# Patient Record
Sex: Female | Born: 1950
Health system: Southern US, Community
[De-identification: ages and names within clinical notes are randomized; demographics above are authoritative.]

## PROBLEM LIST (undated history)

## (undated) DIAGNOSIS — R42 Dizziness and giddiness: Secondary | ICD-10-CM

## (undated) DIAGNOSIS — I10 Essential (primary) hypertension: Secondary | ICD-10-CM

## (undated) DIAGNOSIS — T7840XA Allergy, unspecified, initial encounter: Secondary | ICD-10-CM

## (undated) DIAGNOSIS — R011 Cardiac murmur, unspecified: Secondary | ICD-10-CM

## (undated) HISTORY — DX: Allergy, unspecified, initial encounter: T78.40XA

## (undated) HISTORY — PX: ABDOMINAL HYSTERECTOMY: SHX81

## (undated) HISTORY — PX: TONSILLECTOMY: SUR1361

## (undated) HISTORY — DX: Cardiac murmur, unspecified: R01.1

## (undated) HISTORY — DX: Essential (primary) hypertension: I10

---

## 1998-07-04 ENCOUNTER — Emergency Department (HOSPITAL_COMMUNITY): Admission: EM | Admit: 1998-07-04 | Discharge: 1998-07-04 | Payer: Self-pay | Admitting: Emergency Medicine

## 1999-05-09 ENCOUNTER — Emergency Department (HOSPITAL_COMMUNITY): Admission: EM | Admit: 1999-05-09 | Discharge: 1999-05-09 | Payer: Self-pay | Admitting: Internal Medicine

## 1999-07-08 ENCOUNTER — Emergency Department (HOSPITAL_COMMUNITY): Admission: EM | Admit: 1999-07-08 | Discharge: 1999-07-08 | Payer: Self-pay | Admitting: Emergency Medicine

## 2001-01-18 ENCOUNTER — Encounter: Admission: RE | Admit: 2001-01-18 | Discharge: 2001-01-18 | Payer: Self-pay | Admitting: Family Medicine

## 2001-01-24 ENCOUNTER — Encounter: Admission: RE | Admit: 2001-01-24 | Discharge: 2001-01-24 | Payer: Self-pay | Admitting: Sports Medicine

## 2001-01-24 ENCOUNTER — Encounter: Payer: Self-pay | Admitting: Sports Medicine

## 2001-02-15 ENCOUNTER — Encounter: Admission: RE | Admit: 2001-02-15 | Discharge: 2001-02-15 | Payer: Self-pay | Admitting: Family Medicine

## 2001-03-16 ENCOUNTER — Encounter: Admission: RE | Admit: 2001-03-16 | Discharge: 2001-03-16 | Payer: Self-pay | Admitting: Family Medicine

## 2003-10-25 ENCOUNTER — Emergency Department (HOSPITAL_COMMUNITY): Admission: EM | Admit: 2003-10-25 | Discharge: 2003-10-25 | Payer: Self-pay

## 2007-05-28 ENCOUNTER — Emergency Department (HOSPITAL_COMMUNITY): Admission: EM | Admit: 2007-05-28 | Discharge: 2007-05-28 | Payer: Self-pay | Admitting: Emergency Medicine

## 2008-05-09 ENCOUNTER — Emergency Department (HOSPITAL_COMMUNITY): Admission: EM | Admit: 2008-05-09 | Discharge: 2008-05-09 | Payer: Self-pay | Admitting: Emergency Medicine

## 2010-10-22 ENCOUNTER — Emergency Department (HOSPITAL_COMMUNITY): Admission: EM | Admit: 2010-10-22 | Discharge: 2010-10-22 | Payer: Self-pay | Admitting: Emergency Medicine

## 2011-02-16 LAB — URINE MICROSCOPIC-ADD ON

## 2011-02-16 LAB — URINALYSIS, ROUTINE W REFLEX MICROSCOPIC
Bilirubin Urine: NEGATIVE
Glucose, UA: NEGATIVE mg/dL
Hgb urine dipstick: NEGATIVE
Ketones, ur: NEGATIVE mg/dL
Leukocytes, UA: NEGATIVE
Nitrite: POSITIVE — AB
Protein, ur: NEGATIVE mg/dL
Specific Gravity, Urine: 1.014 (ref 1.005–1.030)
Urobilinogen, UA: 1 mg/dL (ref 0.0–1.0)
pH: 7.5 (ref 5.0–8.0)

## 2011-02-16 LAB — DIFFERENTIAL
Basophils Absolute: 0.1 10*3/uL (ref 0.0–0.1)
Basophils Relative: 1 % (ref 0–1)
Eosinophils Absolute: 0.1 10*3/uL (ref 0.0–0.7)
Monocytes Absolute: 1 10*3/uL (ref 0.1–1.0)
Monocytes Relative: 9 % (ref 3–12)

## 2011-02-16 LAB — CBC
HCT: 37.4 % (ref 36.0–46.0)
MCV: 91.5 fL (ref 78.0–100.0)
Platelets: 280 10*3/uL (ref 150–400)
RBC: 4.09 MIL/uL (ref 3.87–5.11)
RDW: 13.4 % (ref 11.5–15.5)
WBC: 10.7 10*3/uL — ABNORMAL HIGH (ref 4.0–10.5)

## 2011-02-16 LAB — COMPREHENSIVE METABOLIC PANEL
ALT: 11 U/L (ref 0–35)
Albumin: 3.5 g/dL (ref 3.5–5.2)
Alkaline Phosphatase: 73 U/L (ref 39–117)
BUN: 18 mg/dL (ref 6–23)
Chloride: 101 mEq/L (ref 96–112)
Glucose, Bld: 94 mg/dL (ref 70–99)
Potassium: 4.5 mEq/L (ref 3.5–5.1)
Sodium: 140 mEq/L (ref 135–145)
Total Bilirubin: 0.4 mg/dL (ref 0.3–1.2)
Total Protein: 7.6 g/dL (ref 6.0–8.3)

## 2011-02-16 LAB — URINE CULTURE: Colony Count: >=100000

## 2011-09-02 LAB — URINALYSIS, ROUTINE W REFLEX MICROSCOPIC
Bilirubin Urine: NEGATIVE
Ketones, ur: NEGATIVE
Nitrite: NEGATIVE
Protein, ur: NEGATIVE
Urobilinogen, UA: 0.2

## 2011-09-02 LAB — DIFFERENTIAL
Basophils Relative: 1
Eosinophils Absolute: 0
Eosinophils Relative: 1
Lymphocytes Relative: 35
Monocytes Relative: 7
Monocytes Relative: 8
Neutrophils Relative %: 67

## 2011-09-02 LAB — COMPREHENSIVE METABOLIC PANEL
ALT: 13
CO2: 27
Calcium: 8.9
Creatinine, Ser: 0.73
GFR calc non Af Amer: >60
Glucose, Bld: 130 — ABNORMAL HIGH
Sodium: 140
Total Protein: 6.6

## 2011-09-02 LAB — CBC
HCT: 36.2
MCHC: 33.3
MCV: 89.5
Platelets: 271
RDW: 13.6

## 2012-02-04 ENCOUNTER — Encounter (HOSPITAL_COMMUNITY): Payer: Self-pay

## 2012-02-04 ENCOUNTER — Emergency Department (HOSPITAL_COMMUNITY)
Admission: EM | Admit: 2012-02-04 | Discharge: 2012-02-04 | Disposition: A | Discharge status: Home / Self Care | Payer: 59 | Attending: Emergency Medicine | Admitting: Emergency Medicine

## 2012-02-04 DIAGNOSIS — R05 Cough: Secondary | ICD-10-CM | POA: Insufficient documentation

## 2012-02-04 DIAGNOSIS — R059 Cough, unspecified: Secondary | ICD-10-CM | POA: Insufficient documentation

## 2012-02-04 DIAGNOSIS — R1110 Vomiting, unspecified: Secondary | ICD-10-CM

## 2012-02-04 DIAGNOSIS — R197 Diarrhea, unspecified: Secondary | ICD-10-CM | POA: Insufficient documentation

## 2012-02-04 DIAGNOSIS — R112 Nausea with vomiting, unspecified: Secondary | ICD-10-CM | POA: Insufficient documentation

## 2012-02-04 DIAGNOSIS — R5381 Other malaise: Secondary | ICD-10-CM | POA: Insufficient documentation

## 2012-02-04 DIAGNOSIS — R5383 Other fatigue: Secondary | ICD-10-CM | POA: Insufficient documentation

## 2012-02-04 DIAGNOSIS — R51 Headache: Secondary | ICD-10-CM | POA: Insufficient documentation

## 2012-02-04 DIAGNOSIS — Z79899 Other long term (current) drug therapy: Secondary | ICD-10-CM | POA: Insufficient documentation

## 2012-02-04 LAB — CBC
HCT: 39.4 % (ref 36.0–46.0)
Hemoglobin: 12.7 g/dL (ref 12.0–15.0)
MCH: 29.3 pg (ref 26.0–34.0)
MCV: 91 fL (ref 78.0–100.0)
Platelets: 301 10*3/uL (ref 150–400)
RBC: 4.33 MIL/uL (ref 3.87–5.11)
WBC: 9.1 10*3/uL (ref 4.0–10.5)

## 2012-02-04 LAB — BASIC METABOLIC PANEL
CO2: 31 mEq/L (ref 19–32)
Calcium: 9.5 mg/dL (ref 8.4–10.5)
Chloride: 98 mEq/L (ref 96–112)
Glucose, Bld: 111 mg/dL — ABNORMAL HIGH (ref 70–99)
Potassium: 4 mEq/L (ref 3.5–5.1)
Sodium: 136 mEq/L (ref 135–145)

## 2012-02-04 LAB — URINALYSIS, ROUTINE W REFLEX MICROSCOPIC
Bilirubin Urine: NEGATIVE
Glucose, UA: NEGATIVE mg/dL
Hgb urine dipstick: NEGATIVE
Ketones, ur: NEGATIVE mg/dL
Nitrite: NEGATIVE
Specific Gravity, Urine: 1.012 (ref 1.005–1.030)
pH: 6 (ref 5.0–8.0)

## 2012-02-04 MED ORDER — SODIUM CHLORIDE 0.9 % IV BOLUS (SEPSIS)
1000.0000 mL | Freq: Once | INTRAVENOUS | Status: AC
  Administered 2012-02-04: 1000 mL via INTRAVENOUS

## 2012-02-04 MED ORDER — ONDANSETRON 4 MG PO TBDP: 4.0000 mg | ORAL_TABLET | Freq: Three times a day (TID) | ORAL | Status: AC | PRN

## 2012-02-04 MED ORDER — DIPHENOXYLATE-ATROPINE 2.5-0.025 MG PO TABS: 1.0000 | ORAL_TABLET | Freq: Four times a day (QID) | ORAL | Status: DC | PRN

## 2012-02-04 MED ORDER — ONDANSETRON HCL 4 MG/2ML IJ SOLN
4.0000 mg | Freq: Once | INTRAMUSCULAR | Status: AC
  Administered 2012-02-04: 4 mg via INTRAVENOUS
  Filled 2012-02-04: qty 2

## 2012-02-04 MED ORDER — DIPHENOXYLATE-ATROPINE 2.5-0.025 MG PO TABS: 1.0000 | ORAL_TABLET | Freq: Four times a day (QID) | ORAL | Status: AC | PRN

## 2012-02-04 MED ORDER — ONDANSETRON 4 MG PO TBDP: 4.0000 mg | ORAL_TABLET | Freq: Three times a day (TID) | ORAL | Status: DC | PRN

## 2012-02-04 NOTE — ED Notes (Signed)
Pt here with c/o N/V/D/cough since Tuesday with increase in intensity, also c/o generealized weakness and fatigue.

## 2012-02-04 NOTE — ED Provider Notes (Signed)
History     CSN: [id number]  Arrival date & time 02/04/12  4098   First MD Initiated Contact with Patient 02/04/12 [phone]      Chief Complaint  Patient presents with  . Nausea  . Emesis  . Diarrhea  . Cough  . Headache    (Consider location/radiation/quality/duration/timing/severity/associated sxs/prior treatment) HPI  Pt presents to the ED with complaints of nausea, vomiting, diarrhea, cough and headache since Tuesday. The course of her symptoms have been stable and not getting worse. 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 She then began to cough and vomited up mucus. She called he daughter and had her bring her to the ED. The patient admits to getting the flu shot this year. She denies having any abdominal pains, chest pains, fevers, or focal weakness. She admits to feeling weaker than normal because she isn't keeping any fluids down.  History reviewed. No pertinent past medical history.  Past Surgical History  Procedure Date  . Abdominal hysterectomy     History reviewed. No pertinent family history.  History  Substance Use Topics  . Smoking status: Former Games developer  . Smokeless tobacco: Not on file  . Alcohol Use:     OB History    Grav Para Term Preterm Abortions TAB SAB Ect Mult Living                  Review of Systems  All other systems reviewed and are negative.    Allergies  Review of patient's allergies indicates no known allergies.  Home Medications   Current Outpatient Rx  Name Route Sig Dispense Refill  . GUAIFENESIN 100 MG/5ML PO SOLN Oral Take 10 mLs by mouth every 4 (four) hours as needed. For cough.    [name] DIPHENOXYLATE-ATROPINE 2.5-0.025 MG PO TABS Oral Take 1 tablet by mouth 4 (four) times daily as needed for diarrhea or loose stools. 30 tablet 0  . ONDANSETRON 4 MG PO TBDP Oral Take 1 tablet (4 mg total) by mouth every 8 (eight) hours as needed for nausea. 20 tablet  0    BP 136/74  Pulse 70  Temp(Src) 97.6 F (36.4 C) (Oral)  Resp 18  Ht 5\' 6"  (1.676 m)  Wt 200 lb (90.719 kg)  BMI 32.28 kg/m2  SpO2 98%  Physical Exam  Nursing note and vitals reviewed. Constitutional: She appears well-developed and well-nourished. No distress.  HENT:  Head: Normocephalic and atraumatic.  Eyes: Pupils are equal, round, and reactive to light.  Neck: Normal range of motion. Neck supple.  Cardiovascular: Normal rate and regular rhythm.   Pulmonary/Chest: Effort normal. She has no wheezes. She exhibits no tenderness.  Abdominal: Soft. She exhibits no distension and no mass. There is no tenderness. There is no rebound and no guarding.  Musculoskeletal:       Left hip: She exhibits normal range of motion, normal strength, no tenderness, no bony tenderness, no swelling, no crepitus, no deformity and no laceration.       Legs: Neurological: She is alert.  Skin: Skin is warm and dry.    ED Course  Procedures (including critical care time)  Labs Reviewed  BASIC METABOLIC PANEL - Abnormal; Notable for the following:    Glucose, Bld 111 (*)    All other components within normal limits  CBC  URINALYSIS, ROUTINE W REFLEX MICROSCOPIC   No results found.   1. Vomiting  and diarrhea       MDM  pts laboratory work has come back showing no abnormal lab values. Pt has been given 2 L fluids in ED and Zofran. Pt feeling much better. Discussed importance of staying hydrated with patient and will give work-note so that she can rest.   Pt given Rx for lomotil and Zofran to help with symptoms as needed.        Dorthula Matas, PA 02/04/12 1011

## 2012-02-04 NOTE — Discharge Instructions (Signed)
B.R.A.T. Diet Your doctor has recommended the B.R.A.T. diet for you or your child until the condition improves. This is often used to help control diarrhea and vomiting symptoms. If you or your child can tolerate clear liquids, you may have:  Bananas.   Rice.   Applesauce.   Toast (and other simple starches such as crackers, potatoes, noodles).  Be sure to avoid dairy products, meats, and fatty foods until symptoms are better. Fruit juices such as apple, grape, and prune juice can make diarrhea worse. Avoid these. Continue this diet for 2 days or as instructed by your caregiver. Document Released: 11/22/2005 Document Revised: 08/04/2011 Document Reviewed: 05/11/2007 [facility] Patient Information 2012 [place], [place].Diet for Diarrhea, Adult Having frequent, runny stools (diarrhea) has many causes. Diarrhea may be caused or worsened by food or drink. Diarrhea may be relieved by changing your diet. IF YOU ARE NOT TOLERATING SOLID FOODS:  Drink enough water and fluids to keep your urine clear or pale yellow.   Avoid sugary drinks and sodas as well as milk-based beverages.   Avoid beverages containing caffeine and alcohol.   You may try rehydrating beverages. You can make your own by following this recipe:    tsp table salt.    tsp baking soda.   ? tsp salt substitute (potassium chloride).   1 tbs + 1 tsp sugar.   1 qt water.  As your stools become more solid, you can start eating solid foods. Add foods one at a time. If a certain food causes your diarrhea to get worse, avoid that food and try other foods. A low fiber, low-fat, and lactose-free diet is recommended. Small, frequent meals may be better tolerated.  Starches  Allowed:  White, [place], and pita breads, plain rolls, buns, bagels. Plain muffins, matzo. Soda, saltine, or graham crackers. Pretzels, melba toast, zwieback. Cooked cereals made with water: cornmeal, farina, cream cereals. Dry cereals: refined corn, wheat, rice.  Potatoes prepared any way without skins, refined macaroni, spaghetti, noodles, refined rice.   Avoid:  Bread, rolls, or crackers made with whole wheat, multi-grains, rye, bran seeds, nuts, or coconut. Corn tortillas or taco shells. Cereals containing whole grains, multi-grains, bran, coconut, nuts, or raisins. Cooked or dry oatmeal. Coarse wheat cereals, granola. Cereals advertised as "high-fiber." Potato skins. Whole grain pasta, wild or brown rice. Popcorn. Sweet potatoes/yams. Sweet rolls, doughnuts, waffles, pancakes, sweet breads.  Vegetables  Allowed: Strained tomato and vegetable juices. Most well-cooked and canned vegetables without seeds. Fresh: Tender lettuce, cucumber without the skin, cabbage, spinach, bean sprouts.   Avoid: Fresh, cooked, or canned: Artichokes, baked beans, beet greens, broccoli, Brussels sprouts, corn, kale, legumes, peas, sweet potatoes. Cooked: Green or red cabbage, spinach. Avoid large servings of any vegetables, because vegetables shrink when cooked, and they contain more fiber per serving than fresh vegetables.  Fruit  Allowed: All fruit juices except prune juice. Cooked or canned: Apricots, applesauce, cantaloupe, cherries, fruit cocktail, grapefruit, grapes, kiwi, mandarin oranges, peaches, pears, plums, watermelon. Fresh: Apples without skin, ripe banana, grapes, cantaloupe, cherries, grapefruit, peaches, oranges, plums. Keep servings limited to  cup or 1 piece.   Avoid: Fresh: Apple with skin, apricots, mango, pears, raspberries, strawberries. Prune juice, stewed or dried prunes. Dried fruits, raisins, dates. Large servings of all fresh fruits.  Meat and Meat Substitutes  Allowed: Ground or well-cooked tender beef, ham, veal, lamb, pork, or poultry. Eggs, plain cheese. Fish, oysters, shrimp, lobster, other seafoods. Liver, organ meats.   Avoid: Tough, fibrous meats with gristle. Peanut  butter, smooth or chunky. Cheese, nuts, seeds, legumes, dried peas, beans,  lentils.  Milk  Allowed: Yogurt, lactose-free milk, kefir, drinkable yogurt, buttermilk, soy milk.   Avoid: Milk, chocolate milk, beverages made with milk, such as milk shakes.  Soups  Allowed: Bouillon, broth, or soups made from allowed foods. Any strained soup.   Avoid: Soups made from vegetables that are not allowed, cream or milk-based soups.  Desserts and Sweets  Allowed: Sugar-free gelatin, sugar-free frozen ice pops made without sugar alcohol.   Avoid: Plain cakes and cookies, pie made with allowed fruit, pudding, custard, cream pie. Gelatin, fruit, ice, sherbet, frozen ice pops. Ice cream, ice milk without nuts. Plain hard candy, honey, jelly, molasses, syrup, sugar, chocolate syrup, gumdrops, marshmallows.  Fats and Oils  Allowed: Avoid any fats and oils.   Avoid: Seeds, nuts, olives, avocados. Margarine, butter, cream, mayonnaise, salad oils, plain salad dressings made from allowed foods. Plain gravy, crisp bacon without rind.  Beverages  Allowed: Water, decaffeinated teas, oral rehydration solutions, sugar-free beverages.   Avoid: Fruit juices, caffeinated beverages (coffee, tea, soda or pop), alcohol, sports drinks, or lemon-lime soda or pop.  Condiments  Allowed: Ketchup, mustard, horseradish, vinegar, cream sauce, cheese sauce, cocoa powder. Spices in moderation: allspice, basil, bay leaves, celery powder or leaves, cinnamon, cumin powder, curry powder, ginger, mace, marjoram, onion or garlic powder, oregano, paprika, parsley flakes, ground pepper, rosemary, sage, savory, tarragon, thyme, turmeric.   Avoid: Coconut, honey.  Weight Monitoring: Weigh yourself every day. You should weigh yourself in the morning after you urinate and before you eat breakfast. Wear the same amount of clothing when you weigh yourself. Record your weight daily. Bring your recorded weights to your clinic visits. Tell your caregiver right away if you have gained 3 lb/1.4 kg or more in 1 day, 5  lb/2.3 kg in a week, or whatever amount you were told to report. SEEK IMMEDIATE MEDICAL CARE IF:   You are unable to keep fluids down.   You start to throw up (vomit) or diarrhea keeps coming back (persistent).   Abdominal pain develops, increases, or can be felt in one place (localizes).   You have an oral temperature above 102 F (38.9 C), not controlled by medicine.   Diarrhea contains blood or mucus.   You develop excessive weakness, dizziness, fainting, or extreme thirst.  MAKE SURE YOU:   Understand these instructions.   Will watch your condition.   Will get help right away if you are not doing well or get worse.  Document Released: 02/12/2004 Document Revised: 08/04/2011 Document Reviewed: 06/05/2009 [facility] Patient Information 2012 [place], [place].Nausea and Vomiting Nausea is a sick feeling that often comes before throwing up (vomiting). Vomiting is a reflex where stomach contents come out of your mouth. Vomiting can cause severe loss of body fluids (dehydration). Children and elderly adults can become dehydrated quickly, especially if they also have diarrhea. Nausea and vomiting are symptoms of a condition or disease. It is important to find the cause of your symptoms. CAUSES   Direct irritation of the stomach lining. This irritation can result from increased acid production (gastroesophageal reflux disease), infection, food poisoning, taking certain medicines (such as nonsteroidal anti-inflammatory drugs), alcohol use, or tobacco use.   Signals from the brain.These signals could be caused by a headache, heat exposure, an inner ear disturbance, increased pressure in the brain from injury, infection, a tumor, or a concussion, pain, emotional stimulus, or metabolic problems.   An obstruction in the gastrointestinal tract (  bowel obstruction).   Illnesses such as diabetes, hepatitis, gallbladder problems, appendicitis, kidney problems, cancer, sepsis, atypical symptoms of a  heart attack, or eating disorders.   Medical treatments such as chemotherapy and radiation.   Receiving medicine that makes you sleep (general anesthetic) during surgery.  DIAGNOSIS Your caregiver may ask for tests to be done if the problems do not improve after a few days. Tests may also be done if symptoms are severe or if the reason for the nausea and vomiting is not clear. Tests may include:  Urine tests.   Blood tests.   Stool tests.   Cultures (to look for evidence of infection).   X-rays or other imaging studies.  Test results can help your caregiver make decisions about treatment or the need for additional tests. TREATMENT You need to stay well hydrated. Drink frequently but in small amounts.You may wish to drink water, sports drinks, clear broth, or eat frozen ice pops or gelatin dessert to help stay hydrated.When you eat, eating slowly may help prevent nausea.There are also some antinausea medicines that may help prevent nausea. HOME CARE INSTRUCTIONS   Take all medicine as directed by your caregiver.   If you do not have an appetite, do not force yourself to eat. However, you must continue to drink fluids.   If you have an appetite, eat a normal diet unless your caregiver tells you differently.   Eat a variety of complex carbohydrates (rice, wheat, potatoes, bread), lean meats, yogurt, fruits, and vegetables.   Avoid high-fat foods because they are more difficult to digest.   Drink enough water and fluids to keep your urine clear or pale yellow.   If you are dehydrated, ask your caregiver for specific rehydration instructions. Signs of dehydration may include:   Severe thirst.   Dry lips and mouth.   Dizziness.   Dark urine.   Decreasing urine frequency and amount.   Confusion.   Rapid breathing or pulse.  SEEK IMMEDIATE MEDICAL CARE IF:   You have blood or brown flecks (like coffee grounds) in your vomit.   You have black or bloody stools.   You  have a severe headache or stiff neck.   You are confused.   You have severe abdominal pain.   You have chest pain or trouble breathing.   You do not urinate at least once every 8 hours.   You develop cold or clammy skin.   You continue to vomit for longer than 24 to 48 hours.   You have a fever.  MAKE SURE YOU:   Understand these instructions.   Will watch your condition.   Will get help right away if you are not doing well or get worse.  Document Released: 11/22/2005 Document Revised: 08/04/2011 Document Reviewed: 04/21/2011 [facility] Patient Information 2012 [place], [place].  RESOURCE GUIDE  Dental Problems  Patients with Medicaid: [facility] [phone]                                           [phone] [address] [employer] Phone:  [phone]  Phone:  [phone]  If unable to pay or uninsured, contact:  [facility] or [facility]. to become qualified for the adult dental clinic.  Chronic Pain Problems Contact [facility]  [phone] Patients need to be referred by their primary care doctor.  Insufficient Money for Medicine Contact [employer]:  call "211" or [facility] 860-179-2491.  No Primary Care Doctor Call [facility]  [phone] Other agencies that provide inexpensive medical care    [facility]  [phone]    [facility]  [phone]    [facility]  [phone]    [facility]  [phone]    [facility]  [phone]    [facility]  [phone]  Psychological Services [facility] Behavioral Health  [phone] [facility]  [phone] [facility] Mental Health   [phone] (emergency services [phone])  Substance Abuse Resources [facility]  [phone] [facility] [phone] [facility] [place] [phone] Floydene Flock  [phone] Residential & Outpatient Substance Abuse Program  [phone]  Abuse/Neglect [facility] Child Abuse Hotline 830-599-8143 [facility] Child Abuse Hotline [phone] (After Hours)  Emergency Shelter [facility] [phone]  Maternity Homes [facility] [place] [facility] [place] [phone] [facility] [phone]  MRSA Hotline #:   820-604-6686    [facility] Resources  [facility] [place]     [employer]                          [facility] Dept. [address] [place]                       [address]      [address]  Blondell Reveal Phone:  [phone]                                   Phone:  [phone]                 Phone:  [phone]  [facility] Mental Health Phone:  [phone]  [facility] Child Abuse Hotline (906)334-3999 780-036-1412 (After Hours)

## 2012-02-04 NOTE — ED Notes (Signed)
Pt given discharge instructions and rx, verb understanding, amb with steady gait to discharge window.

## 2012-02-06 NOTE — ED Provider Notes (Signed)
Medical screening examination/treatment/procedure(s) were performed by non-physician practitioner and as supervising physician I was immediately available for consultation/collaboration.   Hurman Horn, MD 02/06/12 [phone]

## 2014-08-19 ENCOUNTER — Emergency Department (HOSPITAL_COMMUNITY): Payer: 59

## 2014-08-19 ENCOUNTER — Encounter (HOSPITAL_COMMUNITY): Payer: Self-pay | Admitting: Emergency Medicine

## 2014-08-19 ENCOUNTER — Emergency Department (HOSPITAL_COMMUNITY)
Admission: EM | Admit: 2014-08-19 | Discharge: 2014-08-19 | Disposition: A | Discharge status: Home / Self Care | Payer: 59 | Attending: Emergency Medicine | Admitting: Emergency Medicine

## 2014-08-19 DIAGNOSIS — R42 Dizziness and giddiness: Secondary | ICD-10-CM | POA: Diagnosis not present

## 2014-08-19 DIAGNOSIS — Z791 Long term (current) use of non-steroidal anti-inflammatories (NSAID): Secondary | ICD-10-CM | POA: Insufficient documentation

## 2014-08-19 DIAGNOSIS — I10 Essential (primary) hypertension: Secondary | ICD-10-CM | POA: Insufficient documentation

## 2014-08-19 DIAGNOSIS — R197 Diarrhea, unspecified: Secondary | ICD-10-CM | POA: Diagnosis not present

## 2014-08-19 DIAGNOSIS — Z87891 Personal history of nicotine dependence: Secondary | ICD-10-CM | POA: Diagnosis not present

## 2014-08-19 DIAGNOSIS — R011 Cardiac murmur, unspecified: Secondary | ICD-10-CM | POA: Insufficient documentation

## 2014-08-19 DIAGNOSIS — R112 Nausea with vomiting, unspecified: Secondary | ICD-10-CM | POA: Insufficient documentation

## 2014-08-19 LAB — COMPREHENSIVE METABOLIC PANEL
ALBUMIN: 3.8 g/dL (ref 3.5–5.2)
ALT: 9 U/L (ref 0–35)
AST: 12 U/L (ref 0–37)
Alkaline Phosphatase: 74 U/L (ref 39–117)
Anion gap: 13 (ref 5–15)
BUN: 17 mg/dL (ref 6–23)
CO2: 27 mEq/L (ref 19–32)
Calcium: 9.6 mg/dL (ref 8.4–10.5)
Chloride: 96 mEq/L (ref 96–112)
Creatinine, Ser: 0.64 mg/dL (ref 0.50–1.10)
GFR calc Af Amer: >90 mL/min (ref >90)
GFR calc non Af Amer: >90 mL/min (ref >90)
Glucose, Bld: 119 mg/dL — ABNORMAL HIGH (ref 70–99)
POTASSIUM: 4 meq/L (ref 3.7–5.3)
SODIUM: 136 meq/L — AB (ref 137–147)
TOTAL PROTEIN: 8.1 g/dL (ref 6.0–8.3)
Total Bilirubin: 0.3 mg/dL (ref 0.3–1.2)

## 2014-08-19 LAB — TROPONIN I: Troponin I: <0.3 ng/mL (ref <0.30)

## 2014-08-19 LAB — URINALYSIS, ROUTINE W REFLEX MICROSCOPIC
Bilirubin Urine: NEGATIVE
Glucose, UA: NEGATIVE mg/dL
HGB URINE DIPSTICK: NEGATIVE
Ketones, ur: NEGATIVE mg/dL
LEUKOCYTES UA: NEGATIVE
Nitrite: NEGATIVE
Protein, ur: NEGATIVE mg/dL
SPECIFIC GRAVITY, URINE: 1.012 (ref 1.005–1.030)
UROBILINOGEN UA: 0.2 mg/dL (ref 0.0–1.0)
pH: 7.5 (ref 5.0–8.0)

## 2014-08-19 LAB — CBC WITH DIFFERENTIAL/PLATELET
BASOS ABS: 0 10*3/uL (ref 0.0–0.1)
BASOS PCT: 0 % (ref 0–1)
EOS PCT: 0 % (ref 0–5)
Eosinophils Absolute: 0 10*3/uL (ref 0.0–0.7)
HCT: 40.2 % (ref 36.0–46.0)
Hemoglobin: 13.2 g/dL (ref 12.0–15.0)
LYMPHS PCT: 15 % (ref 12–46)
Lymphs Abs: 1.5 10*3/uL (ref 0.7–4.0)
MCH: 29.9 pg (ref 26.0–34.0)
MCHC: 32.8 g/dL (ref 30.0–36.0)
MCV: 91 fL (ref 78.0–100.0)
Monocytes Absolute: 0.5 10*3/uL (ref 0.1–1.0)
Monocytes Relative: 5 % (ref 3–12)
NEUTROS ABS: 7.7 10*3/uL (ref 1.7–7.7)
Neutrophils Relative %: 80 % — ABNORMAL HIGH (ref 43–77)
PLATELETS: 256 10*3/uL (ref 150–400)
RBC: 4.42 MIL/uL (ref 3.87–5.11)
RDW: 13.4 % (ref 11.5–15.5)
WBC: 9.7 10*3/uL (ref 4.0–10.5)

## 2014-08-19 MED ORDER — ONDANSETRON 4 MG PO TBDP: ORAL_TABLET | ORAL | Status: DC

## 2014-08-19 MED ORDER — MECLIZINE HCL 25 MG PO TABS
25.0000 mg | ORAL_TABLET | Freq: Once | ORAL | Status: AC
  Administered 2014-08-19: 25 mg via ORAL
  Filled 2014-08-19: qty 1

## 2014-08-19 MED ORDER — SODIUM CHLORIDE 0.9 % IV BOLUS (SEPSIS)
1000.0000 mL | Freq: Once | INTRAVENOUS | Status: AC
  Administered 2014-08-19: 1000 mL via INTRAVENOUS

## 2014-08-19 MED ORDER — MECLIZINE HCL 25 MG PO TABS: 12.5000 mg | ORAL_TABLET | Freq: Three times a day (TID) | ORAL | Status: DC | PRN

## 2014-08-19 MED ORDER — ONDANSETRON HCL 4 MG/2ML IJ SOLN
4.0000 mg | Freq: Once | INTRAMUSCULAR | Status: AC
  Administered 2014-08-19: 4 mg via INTRAVENOUS
  Filled 2014-08-19: qty 2

## 2014-08-19 MED ORDER — LORAZEPAM 2 MG/ML IJ SOLN
1.0000 mg | Freq: Once | INTRAMUSCULAR | Status: AC
  Administered 2014-08-19: 1 mg via INTRAVENOUS
  Filled 2014-08-19: qty 1

## 2014-08-19 NOTE — ED Notes (Signed)
Pt went to the restroom patient missed the hat in the toilet. Dr is aware

## 2014-08-19 NOTE — ED Notes (Signed)
Bed: WA20 Expected date:  Expected time:  Means of arrival:  Comments: EMS-high BP

## 2014-08-19 NOTE — ED Notes (Signed)
Patient transported to CT 

## 2014-08-19 NOTE — ED Provider Notes (Signed)
CSN: [id number]     Arrival date & time 08/19/14  1041 History   First MD Initiated Contact with Patient 08/19/14 1115     Chief Complaint  Patient presents with  . Nausea  . Emesis  . Hypertension     (Consider location/radiation/quality/duration/timing/severity/associated sxs/prior Treatment) HPI 63 year old female presents with nausea since yesterday. Today started having vomiting and dizziness. She describes the dizziness as a room spinning sensation. It does not feel like she is going to pass out. There's no associated headache, chest pain, palpitations, or shortness of breath. No weakness or numbness. No confusion or trouble speaking. She had one loose bowel movement but otherwise not had diarrhea. She had [place] food last night but is not sure if it causes her symptoms or not. The patient denies any pain at this time. The dizziness only occurs with sitting there is not occur with turning her head.  History reviewed. No pertinent past medical history. Past Surgical History  Procedure Laterality Date  . Abdominal hysterectomy     No family history on file. History  Substance Use Topics  . Smoking status: Former Games developer  . Smokeless tobacco: Not on file  . Alcohol Use:    OB History   Grav Para Term Preterm Abortions TAB SAB Ect Mult Living                 Review of Systems  Constitutional: Negative for fever.  Respiratory: Negative for shortness of breath.   Cardiovascular: Negative for chest pain.  Gastrointestinal: Positive for nausea, vomiting and diarrhea (one loose bowel movement). Negative for abdominal pain.  Neurological: Positive for dizziness. Negative for weakness, numbness and headaches.  All other systems reviewed and are negative.     Allergies  Review of patient's allergies indicates no known allergies.  Home Medications   Prior to Admission medications   Medication Sig Start Date End Date Taking? Authorizing Provider  Naproxen Sodium (ALEVE PO)  Take 2 tablets by mouth 1 day or 1 dose.   Yes Historical Provider, MD   BP 139/75  Pulse 64  Temp(Src) 97.6 F (36.4 C) (Oral)  Resp 20  SpO2 97% Physical Exam  Nursing note and vitals reviewed. Constitutional: She is oriented to person, place, and time. She appears well-developed and well-nourished. No distress.  HENT:  Head: Normocephalic and atraumatic.  Right Ear: External ear normal.  Left Ear: External ear normal.  Nose: Nose normal.  Eyes: EOM are normal. Pupils are equal, round, and reactive to light. Right eye exhibits no discharge. Left eye exhibits no discharge.  No nystagmus  Cardiovascular: Normal rate and regular rhythm.   Murmur heard. Pulmonary/Chest: Effort normal and breath sounds normal.  Abdominal: Soft. She exhibits no distension. There is no tenderness.  Neurological: She is alert and oriented to person, place, and time. She has normal reflexes.  CN 2-12 grossly intact. 5/5 strength in all 4 extremities. Normal finger to nose and heel to shin  Skin: Skin is warm and dry.    ED Course  Procedures (including critical care time) Labs Review Labs Reviewed  CBC WITH DIFFERENTIAL - Abnormal; Notable for the following:    Neutrophils Relative % 80 (*)    All other components within normal limits  COMPREHENSIVE METABOLIC PANEL - Abnormal; Notable for the following:    Sodium 136 (*)    Glucose, Bld 119 (*)    All other components within normal limits  TROPONIN I  URINALYSIS, ROUTINE W REFLEX MICROSCOPIC  CBG  MONITORING, ED    Imaging Review Ct Head Wo Contrast  08/19/2014   CLINICAL DATA:  Nausea, vomiting, vertigo.  EXAM: CT HEAD WITHOUT CONTRAST  TECHNIQUE: Contiguous axial images were obtained from the base of the skull through the vertex without intravenous contrast.  COMPARISON:  None.  FINDINGS: There is chronic diffuse atrophy. There is no midline shift, hydrocephalus, or mass. No acute hemorrhage or acute transcortical infarct is identified. The  bony calvarium is intact. The visualized sinuses are clear.  IMPRESSION: No focal acute intracranial abnormality identified. Chronic diffuse atrophy.   Electronically Signed   By: Sherian Rein M.D.   On: 08/19/2014 12:41   Mr Brain Wo Contrast  08/19/2014   CLINICAL DATA:  Nausea, vomiting and dizziness since 08/18/2014. No known injury. Question cerebellar infarct. Initial encounter.  EXAM: MRI HEAD WITHOUT CONTRAST  TECHNIQUE: Multiplanar, multiecho pulse sequences of the brain and surrounding structures were obtained without intravenous contrast.  COMPARISON:  08/19/2014 CT.  No comparison MR  FINDINGS: Some of the sequences are motion degraded. Fast technique imaging had to be utilized.  No acute infarct.  No intracranial hemorrhage.  No hydrocephalus.  No intracranial mass lesion noted on this unenhanced exam.  Major intracranial vascular structures are patent.  Cervical medullary junction, pituitary region, pineal region and orbital structures unremarkable.  Minimal mucosal thickening/ partial opacification inferior left mastoid air cells. Minimal mucosal thickening right sphenoid sinus and ethmoid sinus air cells bilaterally.  IMPRESSION: No acute infarct.  Please see above.   Electronically Signed   By: Bridgett Larsson M.D.   On: 08/19/2014 14:48     EKG Interpretation   Date/Time:  Monday August 19 2014 12:00:00 EDT Ventricular Rate:  55 PR Interval:  178 QRS Duration: 82 QT Interval:  493 QTC Calculation: 472 R Axis:   22 Text Interpretation:  Sinus bradycardia Otherwise normal ECG No old  tracing to compare Confirmed by Saliha Salts  MD, Adriona Kaney ([id number]) on 08/19/2014  3:11:47 PM      MDM   Final diagnoses:  Nausea and vomiting in adult  Vertigo    Patient's nausea and dizziness were controlled with Zofran and Antivert in the ED. She's given some fluids, although this sounds more like vertigo than lightheadedness from dehydration. She's not had a significant amount of vomiting or  diarrhea the cause dehydration. She's not feeling like she's going to pass out and thus syncope workup is needed at this time. MRI shows no infarct or mass. Her symptoms have completely resolved and she was able to get up and walk without dizziness or trouble walking. Due to this, will treat her nausea at home with Zofran and give her Antivert prn her dizziness.    Audree Camel, MD 08/19/14 [phone]

## 2014-08-19 NOTE — Discharge Instructions (Signed)
Dizziness °Dizziness is a common problem. It is a feeling of unsteadiness or light-headedness. You may feel like you are about to faint. Dizziness can lead to injury if you stumble or fall. A person of any age group can suffer from dizziness, but dizziness is more common in older adults. °CAUSES  °Dizziness can be caused by many different things, including: °· Middle ear problems. °· Standing for too long. °· Infections. °· An allergic reaction. °· Aging. °· An emotional response to something, such as the sight of blood. °· Side effects of medicines. °· Tiredness. °· Problems with circulation or blood pressure. °· Excessive use of alcohol or medicines, or illegal drug use. °· Breathing too fast (hyperventilation). °· An irregular heart rhythm (arrhythmia). °· A low red blood cell count (anemia). °· Pregnancy. °· Vomiting, diarrhea, fever, or other illnesses that cause body fluid loss (dehydration). °· Diseases or conditions such as Parkinson's disease, high blood pressure (hypertension), diabetes, and thyroid problems. °· Exposure to extreme heat. °DIAGNOSIS  °Your health care provider will ask about your symptoms, perform a physical exam, and perform an electrocardiogram (ECG) to record the electrical activity of your heart. Your health care provider may also perform other heart or blood tests to determine the cause of your dizziness. These may include: °· Transthoracic echocardiogram (TTE). During echocardiography, sound waves are used to evaluate how blood flows through your heart. °· Transesophageal echocardiogram (TEE). °· Cardiac monitoring. This allows your health care provider to monitor your heart rate and rhythm in real time. °· Holter monitor. This is a portable device that records your heartbeat and can help diagnose heart arrhythmias. It allows your health care provider to track your heart activity for several days if needed. °· Stress tests by exercise or by giving medicine that makes the heart beat  faster. °TREATMENT  °Treatment of dizziness depends on the cause of your symptoms and can vary greatly. °HOME CARE INSTRUCTIONS  °· Drink enough fluids to keep your urine clear or pale yellow. This is especially important in very hot weather. In older adults, it is also important in cold weather. °· Take your medicine exactly as directed if your dizziness is caused by medicines. When taking blood pressure medicines, it is especially important to get up slowly. °¨ Rise slowly from chairs and steady yourself until you feel okay. °¨ In the morning, first sit up on the side of the bed. When you feel okay, stand slowly while holding onto something until you know your balance is fine. °· Move your legs often if you need to stand in one place for a long time. Tighten and relax your muscles in your legs while standing. °· Have someone stay with you for 1-2 days if dizziness continues to be a problem. Do this until you feel you are well enough to stay alone. Have the person call your health care provider if he or she notices changes in you that are concerning. °· Do not drive or use heavy machinery if you feel dizzy. °· Do not drink alcohol. °SEEK IMMEDIATE MEDICAL CARE IF:  °· Your dizziness or light-headedness gets worse. °· You feel nauseous or vomit. °· You have problems talking, walking, or using your arms, hands, or legs. °· You feel weak. °· You are not thinking clearly or you have trouble forming sentences. It may take a friend or family member to notice this. °· You have chest pain, abdominal pain, shortness of breath, or sweating. °· Your vision changes. °· You notice   any bleeding.  You have side effects from medicine that seems to be getting worse rather than better. MAKE SURE YOU:   Understand these instructions.  Will watch your condition.  Will get help right away if you are not doing well or get worse. Document Released: 05/18/2001 Document Revised: 11/27/2013 Document Reviewed: 06/11/2011 [facility]  Patient Information 2015 [place], [place]. This information is not intended to replace advice given to you by your health care provider. Make sure you discuss any questions you have with your health care provider.   Vertigo Vertigo means you feel like you or your surroundings are moving when they are not. Vertigo can be dangerous if it occurs when you are at work, driving, or performing difficult activities.  CAUSES  Vertigo occurs when there is a conflict of signals sent to your brain from the visual and sensory systems in your body. There are many different causes of vertigo, including:  Infections, especially in the inner ear.  A bad reaction to a drug or misuse of alcohol and medicines.  Withdrawal from drugs or alcohol.  Rapidly changing positions, such as lying down or rolling over in bed.  A migraine headache.  Decreased blood flow to the brain.  Increased pressure in the brain from a head injury, infection, tumor, or bleeding. SYMPTOMS  You may feel as though the world is spinning around or you are falling to the ground. Because your balance is upset, vertigo can cause nausea and vomiting. You may have involuntary eye movements (nystagmus). DIAGNOSIS  Vertigo is usually diagnosed by physical exam. If the cause of your vertigo is unknown, your caregiver may perform imaging tests, such as an MRI scan (magnetic resonance imaging). TREATMENT  Most cases of vertigo resolve on their own, without treatment. Depending on the cause, your caregiver may prescribe certain medicines. If your vertigo is related to body position issues, your caregiver may recommend movements or procedures to correct the problem. In rare cases, if your vertigo is caused by certain inner ear problems, you may need surgery. HOME CARE INSTRUCTIONS   Follow your caregiver's instructions.  Avoid driving.  Avoid operating heavy machinery.  Avoid performing any tasks that would be dangerous to you or others during  a vertigo episode.  Tell your caregiver if you notice that certain medicines seem to be causing your vertigo. Some of the medicines used to treat vertigo episodes can actually make them worse in some people. SEEK IMMEDIATE MEDICAL CARE IF:   Your medicines do not relieve your vertigo or are making it worse.  You develop problems with talking, walking, weakness, or using your arms, hands, or legs.  You develop severe headaches.  Your nausea or vomiting continues or gets worse.  You develop visual changes.  A family member notices behavioral changes.  Your condition gets worse. MAKE SURE YOU:  Understand these instructions.  Will watch your condition.  Will get help right away if you are not doing well or get worse. Document Released: 09/01/2005 Document Revised: 02/14/2012 Document Reviewed: 06/10/2011 [facility] Patient Information 2015 [place], [place]. This information is not intended to replace advice given to you by your health care provider. Make sure you discuss any questions you have with your health care provider.   Nausea and Vomiting Nausea is a sick feeling that often comes before throwing up (vomiting). Vomiting is a reflex where stomach contents come out of your mouth. Vomiting can cause severe loss of body fluids (dehydration). Children and elderly adults can become dehydrated quickly,  especially if they also have diarrhea. Nausea and vomiting are symptoms of a condition or disease. It is important to find the cause of your symptoms. CAUSES   Direct irritation of the stomach lining. This irritation can result from increased acid production (gastroesophageal reflux disease), infection, food poisoning, taking certain medicines (such as nonsteroidal anti-inflammatory drugs), alcohol use, or tobacco use.  Signals from the brain.These signals could be caused by a headache, heat exposure, an inner ear disturbance, increased pressure in the brain from injury, infection, a  tumor, or a concussion, pain, emotional stimulus, or metabolic problems.  An obstruction in the gastrointestinal tract (bowel obstruction).  Illnesses such as diabetes, hepatitis, gallbladder problems, appendicitis, kidney problems, cancer, sepsis, atypical symptoms of a heart attack, or eating disorders.  Medical treatments such as chemotherapy and radiation.  Receiving medicine that makes you sleep (general anesthetic) during surgery. DIAGNOSIS Your caregiver may ask for tests to be done if the problems do not improve after a few days. Tests may also be done if symptoms are severe or if the reason for the nausea and vomiting is not clear. Tests may include:  Urine tests.  Blood tests.  Stool tests.  Cultures (to look for evidence of infection).  X-rays or other imaging studies. Test results can help your caregiver make decisions about treatment or the need for additional tests. TREATMENT You need to stay well hydrated. Drink frequently but in small amounts.You may wish to drink water, sports drinks, clear broth, or eat frozen ice pops or gelatin dessert to help stay hydrated.When you eat, eating slowly may help prevent nausea.There are also some antinausea medicines that may help prevent nausea. HOME CARE INSTRUCTIONS   Take all medicine as directed by your caregiver.  If you do not have an appetite, do not force yourself to eat. However, you must continue to drink fluids.  If you have an appetite, eat a normal diet unless your caregiver tells you differently.  Eat a variety of complex carbohydrates (rice, wheat, potatoes, bread), lean meats, yogurt, fruits, and vegetables.  Avoid high-fat foods because they are more difficult to digest.  Drink enough water and fluids to keep your urine clear or pale yellow.  If you are dehydrated, ask your caregiver for specific rehydration instructions. Signs of dehydration may include:  Severe thirst.  Dry lips and  mouth.  Dizziness.  Dark urine.  Decreasing urine frequency and amount.  Confusion.  Rapid breathing or pulse. SEEK IMMEDIATE MEDICAL CARE IF:   You have blood or brown flecks (like coffee grounds) in your vomit.  You have black or bloody stools.  You have a severe headache or stiff neck.  You are confused.  You have severe abdominal pain.  You have chest pain or trouble breathing.  You do not urinate at least once every 8 hours.  You develop cold or clammy skin.  You continue to vomit for longer than 24 to 48 hours.  You have a fever. MAKE SURE YOU:   Understand these instructions.  Will watch your condition.  Will get help right away if you are not doing well or get worse. Document Released: 11/22/2005 Document Revised: 02/14/2012 Document Reviewed: 04/21/2011 [facility] Patient Information 2015 [place], [place]. This information is not intended to replace advice given to you by your health care provider. Make sure you discuss any questions you have with your health care provider.

## 2014-08-19 NOTE — ED Notes (Signed)
Per EMS pt coming from home with c/o nausea and vomiting since yesterday, EMS reports pt was hypertensive on their arrival 180/100.

## 2015-05-27 ENCOUNTER — Ambulatory Visit (INDEPENDENT_AMBULATORY_CARE_PROVIDER_SITE_OTHER): Payer: 59 | Admitting: Internal Medicine

## 2015-05-27 VITALS — BP 158/90 | HR 82 | Temp 98.0°F | Resp 16 | Ht 65.0 in | Wt 272.0 lb

## 2015-05-27 DIAGNOSIS — I10 Essential (primary) hypertension: Secondary | ICD-10-CM

## 2015-05-27 DIAGNOSIS — M1712 Unilateral primary osteoarthritis, left knee: Secondary | ICD-10-CM

## 2015-05-27 DIAGNOSIS — I493 Ventricular premature depolarization: Secondary | ICD-10-CM

## 2015-05-27 DIAGNOSIS — E669 Obesity, unspecified: Secondary | ICD-10-CM | POA: Diagnosis not present

## 2015-05-27 DIAGNOSIS — R011 Cardiac murmur, unspecified: Secondary | ICD-10-CM | POA: Diagnosis not present

## 2015-05-27 DIAGNOSIS — R0683 Snoring: Secondary | ICD-10-CM

## 2015-05-27 DIAGNOSIS — M179 Osteoarthritis of knee, unspecified: Secondary | ICD-10-CM

## 2015-05-27 DIAGNOSIS — J309 Allergic rhinitis, unspecified: Secondary | ICD-10-CM

## 2015-05-27 DIAGNOSIS — R519 Headache, unspecified: Secondary | ICD-10-CM

## 2015-05-27 DIAGNOSIS — R5383 Other fatigue: Secondary | ICD-10-CM

## 2015-05-27 DIAGNOSIS — R51 Headache: Secondary | ICD-10-CM

## 2015-05-27 LAB — POCT CBC
Granulocyte percent: 62 %G (ref 37–80)
HEMATOCRIT: 41.9 % (ref 37.7–47.9)
HEMOGLOBIN: 12.9 g/dL (ref 12.2–16.2)
Lymph, poc: 2.6 (ref 0.6–3.4)
MCH: 28.2 pg (ref 27–31.2)
MCHC: 30.7 g/dL — AB (ref 31.8–35.4)
MCV: 91.7 fL (ref 80–97)
MID (cbc): 0.5 (ref 0–0.9)
MPV: 7.8 fL (ref 0–99.8)
POC Granulocyte: 5 (ref 2–6.9)
POC LYMPH PERCENT: 32 %L (ref 10–50)
POC MID %: 6 %M (ref 0–12)
Platelet Count, POC: 281 10*3/uL (ref 142–424)
RBC: 4.57 M/uL (ref 4.04–5.48)
RDW, POC: 15.8 %
WBC: 8 10*3/uL (ref 4.6–10.2)

## 2015-05-27 LAB — COMPREHENSIVE METABOLIC PANEL
ALT: 11 U/L (ref 0–35)
AST: 19 U/L (ref 0–37)
Albumin: 4 g/dL (ref 3.5–5.2)
Alkaline Phosphatase: 71 U/L (ref 39–117)
BILIRUBIN TOTAL: 0.4 mg/dL (ref 0.2–1.2)
BUN: 15 mg/dL (ref 6–23)
CALCIUM: 9.5 mg/dL (ref 8.4–10.5)
CHLORIDE: 100 meq/L (ref 96–112)
CO2: 31 mEq/L (ref 19–32)
CREATININE: 0.66 mg/dL (ref 0.50–1.10)
Glucose, Bld: 82 mg/dL (ref 70–99)
Potassium: 4.2 mEq/L (ref 3.5–5.3)
Sodium: 141 mEq/L (ref 135–145)
Total Protein: 7.6 g/dL (ref 6.0–8.3)

## 2015-05-27 LAB — TSH: TSH: 2.071 u[IU]/mL (ref 0.350–4.500)

## 2015-05-27 LAB — LIPID PANEL
CHOL/HDL RATIO: 2.8 ratio
Cholesterol: 218 mg/dL — ABNORMAL HIGH (ref 0–200)
HDL: 77 mg/dL (ref >=46)
LDL Cholesterol: 124 mg/dL — ABNORMAL HIGH (ref 0–99)
TRIGLYCERIDES: 86 mg/dL (ref <150)
VLDL: 17 mg/dL (ref 0–40)

## 2015-05-27 LAB — POCT GLYCOSYLATED HEMOGLOBIN (HGB A1C): Hemoglobin A1C: 6.2

## 2015-05-27 MED ORDER — KETOPROFEN 50 MG PO CAPS: 50.0000 mg | ORAL_CAPSULE | Freq: Four times a day (QID) | ORAL | Status: DC | PRN

## 2015-05-27 MED ORDER — FLUTICASONE PROPIONATE 50 MCG/ACT NA SUSP: 2.0000 | Freq: Every day | NASAL | Status: DC

## 2015-05-27 MED ORDER — HYDROCHLOROTHIAZIDE 12.5 MG PO CAPS: 12.5000 mg | ORAL_CAPSULE | Freq: Every day | ORAL | Status: DC

## 2015-05-27 NOTE — Progress Notes (Addendum)
Subjective:  This chart was scribed for Ellamae Sia, MD by Stann Ore, Medical Scribe. This patient was seen in Room 14 and the patient's care was started at 3:08 PM.     Patient ID: Penny King, female    DOB: [birth year], 64 y.o.   MRN: [id number]  HPI Penny King is a 64 y.o. female who presents to [facility] complaining of gradual onset headache that occurred this morning when waking up. She states pain coming from her forehead. She had migraines in the past in the same area with some warning signs of being dizzy. She denies changes to her vision. Not currently nauseated or dizzy.  She also notes being dizzy 2 days ago at church. It was extremely hot because the [facility] went out. She denies nausea. She was able to work yesterday but had a stressful day. She has some coughs at night due to slight cold, and allergies. She has taken tylenol for this.  The cough is present more when completely supine and resolved by sleeping on an incline. No fever or night sweats. Cough is nonproductive. No history of reflux.  She also notes left leg pain near the knee this morning but she believes it is due to her weight. This pain comes and goes over the last several months and is most noticeable when trying to walk up stairs. No swelling of the knee. No pain in the calf. No swelling of the thigh   She acknowledges  her weight gain and would like to lose weight -She plans to eat better.  She denies urinary symptoms, diarrhea, constipation. 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   No current outpatient prescriptions or ongoing diagnoses on file prior to visit. this is because she doesn't have a regular primary care physician. Had a recent health screening her blood pressure was noted to be a but she did not seek treatment.        Review of Systems  Constitutional: Positive for fatigue. Negative for fever and unexpected weight change.       Over the last 2-3 weeks she has been more tired than  usual but cannot connect this to specific issues. There is no history to support a diagnosis of sleep apnea with nonrestorative sleep and daytime hypersomnolence  HENT: Positive for postnasal drip and rhinorrhea. Negative for congestion, sore throat and trouble swallowing.   Eyes: Negative for visual disturbance.  Respiratory: Positive for cough. Negative for chest tightness and shortness of breath.   Cardiovascular: Negative for chest pain, palpitations and leg swelling.  Gastrointestinal: Negative for nausea, vomiting, abdominal pain, diarrhea and constipation.  Endocrine: Negative for cold intolerance, heat intolerance and polyuria.  Genitourinary: Negative for dysuria, urgency, frequency and difficulty urinating.  Musculoskeletal: Positive for arthralgias (left leg). Negative for back pain and gait problem.  Neurological: Positive for dizziness and headaches.  Psychiatric/Behavioral: Negative for sleep disturbance.   her sleep is erratic though as she waits for her husband to return home from work at 1:30 or 2 AM before sleeping and then often gets up by 8 or 9 to go to work at noon     Objective:   Physical Exam  Constitutional: She is oriented to person, place, and time. She appears well-developed and well-nourished. No distress.  HENT:  Head: Normocephalic and atraumatic.  Mouth/Throat: Oropharynx is clear and moist.  Turbinates are swollen with clear rhinorrhea.  Eyes: Conjunctivae and EOM are normal. Pupils are equal, round, and reactive to light.  Neck: Normal range of motion. Neck supple. No JVD present. No thyromegaly present.  Cardiovascular: Normal rate and intact distal pulses.  Exam reveals no gallop.   regular rhythm with frequent premature beats. 2/6 systolic ejection murmur at the outflow tract but no carotid bruits. 2/6 systolic murmur along the left sternal border as well.  Pulmonary/Chest: Effort normal and breath sounds normal. No respiratory distress.    Musculoskeletal: Normal range of motion. She exhibits no edema.  There is tenderness to palpation posteriorly over the left knee but no cord or mass. Pain with full extension but not flexion. Stability to ligament stressing. No effusion. The thighs not swollen or tender. The calf is nontender  Lymphadenopathy:    She has no cervical adenopathy.  Neurological: She is alert and oriented to person, place, and time. She has normal reflexes. No cranial nerve deficit. Coordination normal.  Skin: Skin is warm and dry.  Psychiatric: She has a normal mood and affect. Her behavior is normal. Thought content normal.  Nursing note and vitals reviewed.  EKG shows frequent PVCs with no signs of acute injury. Criteria for left atrial enlargement.  BP 158/90 mmHg  Pulse 82  Temp(Src) 98 F (36.7 C) (Oral)  Resp 16  Ht  (1.651 m)  Wt 272 lb (123.378 kg)  BMI 45.26 kg/m2  SpO2 98%  Results for orders placed or performed in visit on 05/27/15  POCT CBC  Result Value Ref Range   WBC 8.0 4.6 - 10.2 K/uL   Lymph, poc 2.6 0.6 - 3.4   POC LYMPH PERCENT 32.0 10 - 50 %L   MID (cbc) 0.5 0 - 0.9   POC MID % 6.0 0 - 12 %M   POC Granulocyte 5.0 2 - 6.9   Granulocyte percent 62.0 37 - 80 %G   RBC 4.57 4.04 - 5.48 M/uL   Hemoglobin 12.9 12.2 - 16.2 g/dL   HCT, POC 16.1 09.6 - 47.9 %   MCV 91.7 80 - 97 fL   MCH, POC 28.2 27 - 31.2 pg   MCHC 30.7 (A) 31.8 - 35.4 g/dL   RDW, POC 04.5 %   Platelet Count, POC 281 142 - 424 K/uL   MPV 7.8 0 - 99.8 fL  POCT glycosylated hemoglobin (Hb A1C)  Result Value Ref Range   Hemoglobin A1C 6.2         Assessment & Plan:   I have completed the patient encounter in its entirety as documented by the scribe, with editing by me where necessary. Claudette Wermuth P. Merla Riches, M.D.   Obesity - Plan: POCT glycosylated hemoglobin (Hb A1C), Lipid panel, TSH  Essential hypertension//time to initiate treatment - Plan: Metabolic profile/start HCTZ 12.5 mg  Asymptomatic PVCs  - Plan:  Ambulatory referral to Cardiology Heart murmur, systolic -(she says this has been heard in the past but has never been evaluated) Plan: Ambulatory referral to Cardiology--need to r/o AS  Allergic rhinitis, unspecified allergic rhinitis type--- start Flonase to see if this helps with the nocturnal cough//if not resolved at f/u she needs CXR  Osteoarthritis of left knee, unspecified osteoarthritis type--follow for now  Nonintractable headache, unspecified chronicity pattern, unspecified headache type--does not clearly fit a migraine pattern so we will treat this to make it resolve and then follow the history of reoccurence  Fatigue--laboratory evaluation started  Snoring without clear symptoms of sleep apnea-Flonase may help this  Glucose intolerance-mild/secondary to weight  Meds ordered this encounter  Medications  . Multiple Vitamin (MULTIVITAMIN) tablet  Sig: Take 1 tablet by mouth daily.  . hydrochlorothiazide (MICROZIDE) 12.5 MG capsule    Sig: Take 1 capsule (12.5 mg total) by mouth daily.    Dispense:  90 capsule    Refill:  1  . ketoprofen (ORUDIS) 50 MG capsule    Sig: Take 1 capsule (50 mg total) by mouth every 6 (six) hours as needed. headache    Dispense:  30 capsule    Refill:  0  . fluticasone (FLONASE) 50 MCG/ACT nasal spray    Sig: Place 2 sprays into both nostrils daily.    Dispense:  9.9 g    Refill:  6   She will be set up for primary care with Dr. Clelia Croft at our [facility] facility in 4-6 weeks-she is overdue for many health maintenance issues All these issues were discussed with she and her daughter

## 2015-06-01 ENCOUNTER — Encounter: Payer: Self-pay | Admitting: Internal Medicine

## 2015-07-11 ENCOUNTER — Ambulatory Visit (INDEPENDENT_AMBULATORY_CARE_PROVIDER_SITE_OTHER): Payer: 59 | Admitting: Family Medicine

## 2015-07-11 ENCOUNTER — Encounter: Payer: Self-pay | Admitting: Family Medicine

## 2015-07-11 VITALS — BP 153/92 | HR 89 | Temp 98.5°F | Resp 16 | Ht 66.5 in | Wt 271.2 lb

## 2015-07-11 DIAGNOSIS — I10 Essential (primary) hypertension: Secondary | ICD-10-CM | POA: Diagnosis not present

## 2015-07-11 DIAGNOSIS — R7309 Other abnormal glucose: Secondary | ICD-10-CM

## 2015-07-11 DIAGNOSIS — Z0000 Encounter for general adult medical examination without abnormal findings: Secondary | ICD-10-CM

## 2015-07-11 DIAGNOSIS — E669 Obesity, unspecified: Secondary | ICD-10-CM | POA: Diagnosis not present

## 2015-07-11 DIAGNOSIS — N3000 Acute cystitis without hematuria: Secondary | ICD-10-CM

## 2015-07-11 DIAGNOSIS — E785 Hyperlipidemia, unspecified: Secondary | ICD-10-CM | POA: Insufficient documentation

## 2015-07-11 DIAGNOSIS — J309 Allergic rhinitis, unspecified: Secondary | ICD-10-CM

## 2015-07-11 DIAGNOSIS — Z1211 Encounter for screening for malignant neoplasm of colon: Secondary | ICD-10-CM

## 2015-07-11 DIAGNOSIS — Z1212 Encounter for screening for malignant neoplasm of rectum: Secondary | ICD-10-CM

## 2015-07-11 DIAGNOSIS — R011 Cardiac murmur, unspecified: Secondary | ICD-10-CM

## 2015-07-11 DIAGNOSIS — R7303 Prediabetes: Secondary | ICD-10-CM | POA: Insufficient documentation

## 2015-07-11 DIAGNOSIS — Z5181 Encounter for therapeutic drug level monitoring: Secondary | ICD-10-CM | POA: Diagnosis not present

## 2015-07-11 DIAGNOSIS — Z1239 Encounter for other screening for malignant neoplasm of breast: Secondary | ICD-10-CM | POA: Diagnosis not present

## 2015-07-11 DIAGNOSIS — E559 Vitamin D deficiency, unspecified: Secondary | ICD-10-CM | POA: Diagnosis not present

## 2015-07-11 LAB — BASIC METABOLIC PANEL
BUN: 16 mg/dL (ref 7–25)
CHLORIDE: 99 mmol/L (ref 98–110)
CO2: 29 mmol/L (ref 20–31)
Calcium: 9.4 mg/dL (ref 8.6–10.4)
Creat: 0.66 mg/dL (ref 0.50–0.99)
GLUCOSE: 117 mg/dL — AB (ref 65–99)
POTASSIUM: 4.3 mmol/L (ref 3.5–5.3)
Sodium: 139 mmol/L (ref 135–146)

## 2015-07-11 LAB — POCT URINALYSIS DIPSTICK
Bilirubin, UA: NEGATIVE
Glucose, UA: NEGATIVE
Ketones, UA: NEGATIVE
NITRITE UA: POSITIVE
Protein, UA: NEGATIVE
Spec Grav, UA: 1.015
UROBILINOGEN UA: 1
pH, UA: 6

## 2015-07-11 LAB — POCT UA - MICROSCOPIC ONLY
CASTS, UR, LPF, POC: NEGATIVE
CRYSTALS, UR, HPF, POC: NEGATIVE
MUCUS UA: NEGATIVE
YEAST UA: NEGATIVE

## 2015-07-11 NOTE — Patient Instructions (Addendum)
Check your blood pressure 1-2x/wk at work.  Call if >140/90.  Start exercising. Get immunization record from [facility]. Make sure you are taking a daily calcium/vitamin D supplement - and twice a day would be great. Try to find a chewable calcium supplement with 400 to 600 mg of calcium in it and as much vitamin D as you can.  Take this at least once a day, and not with other calcium sources for maximum absorption.  health Maintenance Adopting a healthy lifestyle and getting preventive care can go a long way to promote health and wellness. Talk with your health care provider about what schedule of regular examinations is right for you. This is a good chance for you to check in with your provider about disease prevention and staying healthy. In between checkups, there are plenty of things you can do on your own. Experts have done a lot of research about which lifestyle changes and preventive measures are most likely to keep you healthy. Ask your health care provider for more information. WEIGHT AND DIET  Eat a healthy diet  Be sure to include plenty of vegetables, fruits, low-fat dairy products, and lean protein.  Do not eat a lot of foods high in solid fats, added sugars, or salt.  Get regular exercise. This is one of the most important things you can do for your health.  Most adults should exercise for at least 150 minutes each week. The exercise should increase your heart rate and make you sweat (moderate-intensity exercise).  Most adults should also do strengthening exercises at least twice a week. This is in addition to the moderate-intensity exercise.  Maintain a healthy weight  Body mass index (BMI) is a measurement that can be used to identify possible weight problems. It estimates body fat based on height and weight. Your health care provider can help determine your BMI and help you achieve or maintain a healthy weight.  For females 63 years of age and older:   A BMI below 18.5 is  considered underweight.  A BMI of 18.5 to 24.9 is normal.  A BMI of 25 to 29.9 is considered overweight.  A BMI of 30 and above is considered obese.  Watch levels of cholesterol and blood lipids  You should start having your blood tested for lipids and cholesterol at 62 years of age, then have this test every 5 years.  You may need to have your cholesterol levels checked more often if:  Your lipid or cholesterol levels are high.  You are older than 62 years of age.  You are at high risk for heart disease.  CANCER SCREENING   Lung Cancer  Lung cancer screening is recommended for adults 59-71 years old who are at high risk for lung cancer because of a history of smoking.  A yearly low-dose CT scan of the lungs is recommended for people who:  Currently smoke.  Have quit within the past 15 years.  Have at least a 30-pack-year history of smoking. A pack year is smoking an average of one pack of cigarettes a day for 1 year.  Yearly screening should continue until it has been 15 years since you quit.  Yearly screening should stop if you develop a health problem that would prevent you from having lung cancer treatment.  Breast Cancer  Practice breast self-awareness. This means understanding how your breasts normally appear and feel.  It also means doing regular breast self-exams. Let your health care provider know about any changes, no  matter how small.  If you are in your 20s or 30s, you should have a clinical breast exam (CBE) by a health care provider every 1-3 years as part of a regular health exam.  If you are 62 or older, have a CBE every year. Also consider having a breast X-ray (mammogram) every year.  If you have a family history of breast cancer, talk to your health care provider about genetic screening.  If you are at high risk for breast cancer, talk to your health care provider about having an MRI and a mammogram every year.  Breast cancer gene (BRCA)  assessment is recommended for women who have family members with BRCA-related cancers. BRCA-related cancers include:  Breast.  Ovarian.  Tubal.  Peritoneal cancers.  Results of the assessment will determine the need for genetic counseling and BRCA1 and BRCA2 testing. Cervical Cancer Routine pelvic examinations to screen for cervical cancer are no longer recommended for nonpregnant women who are considered low risk for cancer of the pelvic organs (ovaries, uterus, and vagina) and who do not have symptoms. A pelvic examination may be necessary if you have symptoms including those associated with pelvic infections. Ask your health care provider if a screening pelvic exam is right for you.   The Pap test is the screening test for cervical cancer for women who are considered at risk.  If you had a hysterectomy for a problem that was not cancer or a condition that could lead to cancer, then you no longer need Pap tests.  If you are older than 65 years, and you have had normal Pap tests for the past 10 years, you no longer need to have Pap tests.  If you have had past treatment for cervical cancer or a condition that could lead to cancer, you need Pap tests and screening for cancer for at least 20 years after your treatment.  If you no longer get a Pap test, assess your risk factors if they change (such as having a new sexual partner). This can affect whether you should start being screened again.  Some women have medical problems that increase their chance of getting cervical cancer. If this is the case for you, your health care provider may recommend more frequent screening and Pap tests.  The human papillomavirus (HPV) test is another test that may be used for cervical cancer screening. The HPV test looks for the virus that can cause cell changes in the cervix. The cells collected during the Pap test can be tested for HPV.  The HPV test can be used to screen women 84 years of age and older.  Getting tested for HPV can extend the interval between normal Pap tests from three to five years.  An HPV test also should be used to screen women of any age who have unclear Pap test results.  After 62 years of age, women should have HPV testing as often as Pap tests.  Colorectal Cancer  This type of cancer can be detected and often prevented.  Routine colorectal cancer screening usually begins at 62 years of age and continues through 63 years of age.  Your health care provider may recommend screening at an earlier age if you have risk factors for colon cancer.  Your health care provider may also recommend using home test kits to check for hidden blood in the stool.  A small camera at the end of a tube can be used to examine your colon directly (sigmoidoscopy or colonoscopy). This is  done to check for the earliest forms of colorectal cancer.  Routine screening usually begins at age 50.  Direct examination of the colon should be repeated every 5-10 years through 62 years of age. However, you may need to be screened more often if early forms of precancerous polyps or small growths are found. Skin Cancer  Check your skin from head to toe regularly.  Tell your health care provider about any new moles or changes in moles, especially if there is a change in a mole's shape or color.  Also tell your health care provider if you have a mole that is larger than the size of a pencil eraser.  Always use sunscreen. Apply sunscreen liberally and repeatedly throughout the day.  Protect yourself by wearing long sleeves, pants, a wide-brimmed hat, and sunglasses whenever you are outside. HEART DISEASE, DIABETES, AND HIGH BLOOD PRESSURE   Have your blood pressure checked at least every 1-2 years. High blood pressure causes heart disease and increases the risk of stroke.  If you are between 55 years and 79 years old, ask your health care provider if you should take aspirin to prevent  strokes.  Have regular diabetes screenings. This involves taking a blood sample to check your fasting blood sugar level.  If you are at a normal weight and have a low risk for diabetes, have this test once every three years after 62 years of age.  If you are overweight and have a high risk for diabetes, consider being tested at a younger age or more often. PREVENTING INFECTION  Hepatitis B  If you have a higher risk for hepatitis B, you should be screened for this virus. You are considered at high risk for hepatitis B if:  You were born in a country where hepatitis B is common. Ask your health care provider which countries are considered high risk.  Your parents were born in a high-risk country, and you have not been immunized against hepatitis B (hepatitis B vaccine).  You have HIV or AIDS.  You use needles to inject street drugs.  You live with someone who has hepatitis B.  You have had sex with someone who has hepatitis B.  You get hemodialysis treatment.  You take certain medicines for conditions, including cancer, organ transplantation, and autoimmune conditions. Hepatitis C  Blood testing is recommended for:  Everyone born from 1945 through 1965.  Anyone with known risk factors for hepatitis C. Sexually transmitted infections (STIs)  You should be screened for sexually transmitted infections (STIs) including gonorrhea and chlamydia if:  You are sexually active and are younger than 62 years of age.  You are older than 62 years of age and your health care provider tells you that you are at risk for this type of infection.  Your sexual activity has changed since you were last screened and you are at an increased risk for chlamydia or gonorrhea. Ask your health care provider if you are at risk.  If you do not have HIV, but are at risk, it may be recommended that you take a prescription medicine daily to prevent HIV infection. This is called pre-exposure prophylaxis  (PrEP). You are considered at risk if:  You are sexually active and do not regularly use condoms or know the HIV status of your partner(s).  You take drugs by injection.  You are sexually active with a partner who has HIV. Talk with your health care provider about whether you are at high risk of being infected   with HIV. If you choose to begin PrEP, you should first be tested for HIV. You should then be tested every 3 months for as long as you are taking PrEP.  PREGNANCY   If you are premenopausal and you may become pregnant, ask your health care provider about preconception counseling.  If you may become pregnant, take 400 to 800 micrograms (mcg) of folic acid every day.  If you want to prevent pregnancy, talk to your health care provider about birth control (contraception). OSTEOPOROSIS AND MENOPAUSE   Osteoporosis is a disease in which the bones lose minerals and strength with aging. This can result in serious bone fractures. Your risk for osteoporosis can be identified using a bone density scan.  If you are 5 years of age or older, or if you are at risk for osteoporosis and fractures, ask your health care provider if you should be screened.  Ask your health care provider whether you should take a calcium or vitamin D supplement to lower your risk for osteoporosis.  Menopause may have certain physical symptoms and risks.  Hormone replacement therapy may reduce some of these symptoms and risks. Talk to your health care provider about whether hormone replacement therapy is right for you.  HOME CARE INSTRUCTIONS   Schedule regular health, dental, and eye exams.  Stay current with your immunizations.   Do not use any tobacco products including cigarettes, chewing tobacco, or electronic cigarettes.  If you are pregnant, do not drink alcohol.  If you are breastfeeding, limit how much and how often you drink alcohol.  Limit alcohol intake to no more than 1 drink per day for  nonpregnant women. One drink equals 12 ounces of beer, 5 ounces of wine, or 1 ounces of hard liquor.  Do not use street drugs.  Do not share needles.  Ask your health care provider for help if you need support or information about quitting drugs.  Tell your health care provider if you often feel depressed.  Tell your health care provider if you have ever been abused or do not feel safe at home. Document Released: 06/07/2011 Document Revised: 04/08/2014 Document Reviewed: 10/24/2013 [facility] Patient Information 2015 [place], [place]. This information is not intended to replace advice given to you by your health care provider. Make sure you discuss any questions you have with your health care provider. Potassium Content of Foods Potassium is a mineral found in many foods and drinks. It helps keep fluids and minerals balanced in your body and affects how steadily your heart beats. Potassium also helps control your blood pressure and keep your muscles and nervous system healthy. Certain health conditions and medicines may change the balance of potassium in your body. When this happens, you can help balance your level of potassium through the foods that you do or do not eat. Your health care provider or dietitian may recommend an amount of potassium that you should have each day. The following lists of foods provide the amount of potassium (in parentheses) per serving in each item. HIGH IN POTASSIUM  The following foods and beverages have 200 mg or more of potassium per serving:  Apricots, 2 raw or 5 dry (200 mg).  Artichoke, 1 medium (345 mg).  Avocado, raw,  each (245 mg).  Banana, 1 medium (425 mg).  Beans, lima, or baked beans, canned,  cup (280 mg).  Beans, white, canned,  cup (595 mg).  Beef roast, 3 oz (320 mg).  Beef, ground, 3 oz (270 mg).  Beets, raw or cooked,  cup (260 mg).  Bran muffin, 2 oz (300 mg).  Broccoli,  cup (230 mg).  Brussels sprouts,  cup (250  mg).  Cantaloupe,  cup (215 mg).  Cereal, 100% bran,  cup (200-400 mg).  Cheeseburger, single, fast food, 1 each (225-400 mg).  Chicken, 3 oz (220 mg).  Clams, canned, 3 oz (535 mg).  Crab, 3 oz (225 mg).  Dates, 5 each (270 mg).  Dried beans and peas,  cup (300-475 mg).  Figs, dried, 2 each (260 mg).  Fish: halibut, tuna, cod, snapper, 3 oz (480 mg).  Fish: salmon, haddock, swordfish, perch, 3 oz (300 mg).  Fish, tuna, canned 3 oz (200 mg).  [place] fries, fast food, 3 oz (470 mg).  Granola with fruit and nuts,  cup (200 mg).  Grapefruit juice,  cup (200 mg).  Greens, beet,  cup (655 mg).  Honeydew melon,  cup (200 mg).  Kale, raw, 1 cup (300 mg).  Kiwi, 1 medium (240 mg).  Kohlrabi, rutabaga, parsnips,  cup (280 mg).  Lentils,  cup (365 mg).  Mango, 1 each (325 mg).  Milk, chocolate, 1 cup (420 mg).  Milk: nonfat, low-fat, whole, buttermilk, 1 cup (350-380 mg).  Molasses, 1 Tbsp (295 mg).  Mushrooms,  cup (280) mg.  Nectarine, 1 each (275 mg).  Nuts: almonds, peanuts, hazelnuts, [place], cashew, mixed, 1 oz (200 mg).  Nuts, pistachios, 1 oz (295 mg).  Orange, 1 each (240 mg).  Orange juice,  cup (235 mg).  Papaya, medium,  fruit (390 mg).  Peanut butter, chunky, 2 Tbsp (240 mg).  Peanut butter, smooth, 2 Tbsp (210 mg).  Pear, 1 medium (200 mg).  Pomegranate, 1 whole (400 mg).  Pomegranate juice,  cup (215 mg).  Pork, 3 oz (350 mg).  Potato chips, salted, 1 oz (465 mg).  Potato, baked with skin, 1 medium (925 mg).  Potatoes, boiled,  cup (255 mg).  Potatoes, mashed,  cup (330 mg).  Prune juice,  cup (370 mg).  Prunes, 5 each (305 mg).  Pudding, chocolate,  cup (230 mg).  Pumpkin, canned,  cup (250 mg).  Raisins, seedless,  cup (270 mg).  Seeds, sunflower or pumpkin, 1 oz (240 mg).  Soy milk, 1 cup (300 mg).  Spinach,  cup (420 mg).  Spinach, canned,  cup (370 mg).  Sweet potato, baked with  skin, 1 medium (450 mg).  Swiss chard,  cup (480 mg).  Tomato or vegetable juice,  cup (275 mg).  Tomato sauce or puree,  cup (400-550 mg).  Tomato, raw, 1 medium (290 mg).  Tomatoes, canned,  cup (200-300 mg).  [place], 3 oz (250 mg).  Wheat germ, 1 oz (250 mg).  Winter squash,  cup (250 mg).  Yogurt, plain or fruited, 6 oz (260-435 mg).  Zucchini,  cup (220 mg). MODERATE IN POTASSIUM The following foods and beverages have 50-200 mg of potassium per serving:  Apple, 1 each (150 mg).  Apple juice,  cup (150 mg).  Applesauce,  cup (90 mg).  Apricot nectar,  cup (140 mg).  Asparagus, small spears,  cup or 6 spears (155 mg).  Bagel, cinnamon raisin, 1 each (130 mg).  Bagel, egg or plain, 4 in., 1 each (70 mg).  Beans, green,  cup (90 mg).  Beans, yellow,  cup (190 mg).  Beer, regular, 12 oz (100 mg).  Beets, canned,  cup (125 mg).  Blackberries,  cup (115 mg).  Blueberries,  cup (60 mg).  Bread, whole wheat, 1 slice (70 mg).  Broccoli, raw,  cup (145 mg).  Cabbage,  cup (150 mg).  Carrots, cooked or raw,  cup (180 mg).  Cauliflower, raw,  cup (150 mg).  Celery, raw,  cup (155 mg).  Cereal, bran flakes, cup (120-150 mg).  Cheese, cottage,  cup (110 mg).  Cherries, 10 each (150 mg).  Chocolate, 1 oz bar (165 mg).  Coffee, brewed 6 oz (90 mg).  Corn,  cup or 1 ear (195 mg).  Cucumbers,  cup (80 mg).  Egg, large, 1 each (60 mg).  Eggplant,  cup (60 mg).  Endive, raw, cup (80 mg).  English muffin, 1 each (65 mg).  Fish, orange roughy, 3 oz (150 mg).  Frankfurter, beef or pork, 1 each (75 mg).  Fruit cocktail,  cup (115 mg).  Grape juice,  cup (170 mg).  Grapefruit,  fruit (175 mg).  Grapes,  cup (155 mg).  Greens: kale, turnip, collard,  cup (110-150 mg).  Ice cream or frozen yogurt, chocolate,  cup (175 mg).  Ice cream or frozen yogurt, vanilla,  cup (120-150 mg).  Lemons, limes, 1 each (80  mg).  Lettuce, all types, 1 cup (100 mg).  Mixed vegetables,  cup (150 mg).  Mushrooms, raw,  cup (110 mg).  Nuts: walnuts, pecans, or macadamia, 1 oz (125 mg).  Oatmeal,  cup (80 mg).  Okra,  cup (110 mg).  Onions, raw,  cup (120 mg).  Peach, 1 each (185 mg).  Peaches, canned,  cup (120 mg).  Pears, canned,  cup (120 mg).  Peas, green, frozen,  cup (90 mg).  Peppers, green,  cup (130 mg).  Peppers, red,  cup (160 mg).  Pineapple juice,  cup (165 mg).  Pineapple, fresh or canned,  cup (100 mg).  Plums, 1 each (105 mg).  Pudding, vanilla,  cup (150 mg).  Raspberries,  cup (90 mg).  Rhubarb,  cup (115 mg).  Rice, wild,  cup (80 mg).  Shrimp, 3 oz (155 mg).  Spinach, raw, 1 cup (170 mg).  Strawberries,  cup (125 mg).  Summer squash  cup (175-200 mg).  Swiss chard, raw, 1 cup (135 mg).  Tangerines, 1 each (140 mg).  Tea, brewed, 6 oz (65 mg).  Turnips,  cup (140 mg).  Watermelon,  cup (85 mg).  Wine, red, table, 5 oz (180 mg).  Wine, white, table, 5 oz (100 mg). LOW IN POTASSIUM The following foods and beverages have less than 50 mg of potassium per serving.  Bread, white, 1 slice (30 mg).  Carbonated beverages, 12 oz (less than 5 mg).  Cheese, 1 oz (20-30 mg).  Cranberries,  cup (45 mg).  Cranberry juice cocktail,  cup (20 mg).  Fats and oils, 1 Tbsp (less than 5 mg).  Hummus, 1 Tbsp (32 mg).  Nectar: papaya, mango, or pear,  cup (35 mg).  Rice, white or brown,  cup (50 mg).  Spaghetti or macaroni,  cup cooked (30 mg).  Tortilla, flour or corn, 1 each (50 mg).  Waffle, 4 in., 1 each (50 mg).  Water chestnuts,  cup (40 mg). Document Released: 07/06/2005 Document Revised: 11/27/2013 Document Reviewed: 10/19/2013 [facility] Patient Information 2015 [place], [place]. This information is not intended to replace advice given to you by your health care provider. Make sure you discuss any questions you have with  your health care provider.  Managing Your High Blood Pressure Blood pressure is a measurement of how forceful your blood is pressing  against the walls of the arteries. Arteries are muscular tubes within the circulatory system. Blood pressure does not stay the same. Blood pressure rises when you are active, excited, or nervous; and it lowers during sleep and relaxation. If the numbers measuring your blood pressure stay above normal most of the time, you are at risk for health problems. High blood pressure (hypertension) is a long-term (chronic) condition in which blood pressure is elevated. A blood pressure reading is recorded as two numbers, such as 120 over 80 (or 120/80). The first, higher number is called the systolic pressure. It is a measure of the pressure in your arteries as the heart beats. The second, lower number is called the diastolic pressure. It is a measure of the pressure in your arteries as the heart relaxes between beats.  Keeping your blood pressure in a normal range is important to your overall health and prevention of health problems, such as heart disease and stroke. When your blood pressure is uncontrolled, your heart has to work harder than normal. High blood pressure is a very common condition in adults because blood pressure tends to rise with age. Men and women are equally likely to have hypertension but at different times in life. Before age 44, men are more likely to have hypertension. After 61 years of age, women are more likely to have it. Hypertension is especially common in African Americans. This condition often has no signs or symptoms. The cause of the condition is usually not known. Your caregiver can help you come up with a plan to keep your blood pressure in a normal, healthy range. BLOOD PRESSURE STAGES Blood pressure is classified into four stages: normal, prehypertension, stage 1, and stage 2. Your blood pressure reading will be used to determine what type of  treatment, if any, is necessary. Appropriate treatment options are tied to these four stages:  Normal Systolic pressure (mm Hg): below 120. Diastolic pressure (mm Hg): below 80. Prehypertension Systolic pressure (mm Hg): 120 to 139. Diastolic pressure (mm Hg): 80 to 89. Stage1 Systolic pressure (mm Hg): 140 to 159. Diastolic pressure (mm Hg): 90 to 99. Stage2 Systolic pressure (mm Hg): 160 or above. Diastolic pressure (mm Hg): 100 or above. RISKS RELATED TO HIGH BLOOD PRESSURE Managing your blood pressure is an important responsibility. Uncontrolled high blood pressure can lead to: A heart attack. A stroke. A weakened blood vessel (aneurysm). Heart failure. Kidney damage. Eye damage. Metabolic syndrome. Memory and concentration problems. HOW TO MANAGE YOUR BLOOD PRESSURE Blood pressure can be managed effectively with lifestyle changes and medicines (if needed). Your caregiver will help you come up with a plan to bring your blood pressure within a normal range. Your plan should include the following: Education Read all information provided by your caregivers about how to control blood pressure. Educate yourself on the latest guidelines and treatment recommendations. New research is always being done to further define the risks and treatments for high blood pressure. Lifestylechanges Control your weight. Avoid smoking. Stay physically active. Reduce the amount of salt in your diet. Reduce stress. Control any chronic conditions, such as high cholesterol or diabetes. Reduce your alcohol intake. Medicines Several medicines (antihypertensive medicines) are available, if needed, to bring blood pressure within a normal range. Communication Review all the medicines you take with your caregiver because there may be side effects or interactions. Talk with your caregiver about your diet, exercise habits, and other lifestyle factors that may be contributing to high blood pressure. See  your caregiver  regularly. Your caregiver can help you create and adjust your plan for managing high blood pressure. RECOMMENDATIONS FOR TREATMENT AND FOLLOW-UP  The following recommendations are based on current guidelines for managing high blood pressure in nonpregnant adults. Use these recommendations to identify the proper follow-up period or treatment option based on your blood pressure reading. You can discuss these options with your caregiver. Systolic pressure of 615 to 379 or diastolic pressure of 80 to 89: Follow up with your caregiver as directed. Systolic pressure of 432 to 761 or diastolic pressure of 90 to 100: Follow up with your caregiver within 2 months. Systolic pressure above 470 or diastolic pressure above 929: Follow up with your caregiver within 1 month. Systolic pressure above 574 or diastolic pressure above 734: Consider antihypertensive therapy; follow up with your caregiver within 1 week. Systolic pressure above 037 or diastolic pressure above 096: Begin antihypertensive therapy; follow up with your caregiver within 1 week. Document Released: 08/16/2012 Document Reviewed: 08/16/2012 [facility] Patient Information 2015 [place] [place]. This information is not intended to replace advice given to you by your health care provider. Make sure you discuss any questions you have with your health care provider.

## 2015-07-11 NOTE — Progress Notes (Signed)
Subjective:  This chart was scribed for Sherren Mocha, MD by Veverly Fells, at [facility].  This patient was seen in room 25 and the patient's care was started at 2:53 PM.   Chief Complaint  Patient presents with  . Annual Exam    Patient ID: Penny King, female    DOB: 08/28/1951, 64 y.o.   MRN: [id number]  HPI  HPI Comments: Penny King is a 64 y.o. female who presents to the [facility] to establish care.  Patient saw Dr. Merla Riches a couple weeks ago with a headache and dizziness as well as high blood pressure.  She states that ever since she has started taking her blood pressure medication, she has not been having any headaches.  She is willing to check her blood pressure about 2 times a week.  She is using Flonase occasionally and states that her coughing has gotten much better. Patient is not taking any over the counter allergy medications. She would like to be scheduled for a mammogram and a colonoscopy (last had one "a long time ago").  She is willing to take Vitamins if recommended.  Patient denies any frequency or urgency, vaginal discharge, bowel incontinence or difficulty urinating.  She does not drink or smoke. 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 Patient had a history of a fibroid tumor so she has had a hysterectomy.  Patient is sexually active and is not concerned in regards to STD's. She is up to date with her flu shots and vaccinations.   Exercise plan: Patient states that she has a walk that she can do every morning and will start to change her eating habits.      Blood pressure left arm: 130/80 ------ She is scheduled to see Dr.Brackbill (cardiology) on August 25th.  She is here for a CPE today but she had routine screening labs done 6 weeks prior.  To soon to repeat A1C.  Will check BMP since patient started on HCTZ.  Patients 10 year cardiovascular disease risk is 10.7.  Goal is 3.3.  No prior mammogram.   -------- New  to establish care here.  Seen 6 wks prior by my colleague Dr. Merla Riches for several episodes of dizziness and HA.  Cbc was nml. Pt was found to be HTN and pre-Dm with hgba1c of 6.2 on 05/27/15 - she planned to loose weight with diet and exercise.  She was started on hctz 12.5 and referred to cardiology for evaluation of a murmur and PVCs in addition to above sxs.  Check for aortic stenosis C/o nocturnal cough so started on flonase and will need CXR if continues At last 6 wks ago she had nml cbc, cmp, tsh. Lipid ldl 124, non-hdl of 141  Past Medical History  Diagnosis Date  . Allergy   . Hypertension   . Heart murmur     Current Outpatient Prescriptions on File Prior to Visit  Medication Sig Dispense Refill  . fluticasone (FLONASE) 50 MCG/ACT nasal spray Place 2 sprays into both nostrils daily. 9.9 g 6  . hydrochlorothiazide (MICROZIDE) 12.5 MG capsule Take 1 capsule (12.5 mg total) by mouth daily. 90 capsule 1  . ketoprofen (ORUDIS) 50 MG capsule Take 1 capsule (50 mg total) by mouth every 6 (six) hours as needed. headache 30 capsule 0  . Multiple Vitamin (MULTIVITAMIN) tablet Take 1 tablet by mouth daily.     No current facility-administered medications on file prior  to visit.     There are no active problems to display for this patient.  Past Medical History  Diagnosis Date  . Allergy   . Hypertension   . Heart murmur    Past Surgical History  Procedure Laterality Date  . Abdominal hysterectomy     No Known Allergies Prior to Admission medications   Medication Sig Start Date End Date Taking? Authorizing Provider  fluticasone (FLONASE) 50 MCG/ACT nasal spray Place 2 sprays into both nostrils daily. 05/27/15  Yes Tonye Pearson, MD  hydrochlorothiazide (MICROZIDE) 12.5 MG capsule Take 1 capsule (12.5 mg total) by mouth daily. 05/27/15  Yes Tonye Pearson, MD  ketoprofen (ORUDIS) 50 MG capsule Take 1 capsule (50 mg total) by mouth every 6 (six) hours as needed. headache  05/27/15  Yes Tonye Pearson, MD  Multiple Vitamin (MULTIVITAMIN) tablet Take 1 tablet by mouth daily.    Historical Provider, MD   History   Social History  . Marital Status: Married    Spouse Name: N/A  . Number of Children: N/A  . Years of Education: N/A   Occupational History  . Not on file.   Social History Main Topics  . Smoking status: Former Games developer  . Smokeless tobacco: Not on file  . Alcohol Use: Not on file  . Drug Use: Not on file  . Sexual Activity: Not on file   Other Topics Concern  . Not on file   Social History Narrative     No Known Allergies   Family History  Problem Relation Age of Onset  . Hypertension Father      Review of Systems  Constitutional: Negative for fever and chills.  Respiratory: Negative for cough and shortness of breath.   Cardiovascular: Negative for chest pain.  Gastrointestinal: Negative for nausea and vomiting.  Genitourinary: Negative for urgency and frequency.  Musculoskeletal: Negative for neck pain and neck stiffness.  All other systems reviewed and are negative.      Objective:   Physical Exam  Constitutional: She appears well-developed and well-nourished. No distress.  HENT:  Head: Normocephalic and atraumatic.  oropharynx with post nasal drip Nares dry and erythematous TMs are normal.   Eyes: Pupils are equal, round, and reactive to light.  Neck: Normal range of motion. Neck supple. No tracheal deviation present.  Thyroid feels normal, no lymphadenopathy.    Cardiovascular: Normal rate, regular rhythm, S1 normal and S2 normal.   Murmur heard. 2/6 systolic murmur, right upper sternal border.   Pulmonary/Chest: Effort normal and breath sounds normal. No respiratory distress. She has no wheezes. She has no rales.  Breast exam:  fibrocystic changes in the lateral breast underneath the axilla but very posterior almost outside of where the breast tissue is. Nothing concerning to me on the breast or the axilla.     Abdominal: Bowel sounds are normal.  Musculoskeletal: Normal range of motion.  Skin: Skin is warm and dry.  Psychiatric: She has a normal mood and affect. Her behavior is normal.  Nursing note and vitals reviewed.   Filed Vitals:   07/11/15 1431  BP: 153/92  Pulse: 89  Temp: 98.5 F (36.9 C)  TempSrc: Oral  Resp: 16  Height: 5' 6.5" (1.689 m)  Weight: 271 lb 3.2 oz (123.016 kg)      Assessment & Plan:   1. Annual physical exam   2. Essential hypertension   3. Obesity   4. Heart murmur, systolic   5. Allergic rhinitis, unspecified allergic  rhinitis type   6. Screening for colorectal cancer - sched colonoscopy  7. Screening for breast cancer - schedule mammogram  8. Encounter for medication monitoring   9. Hyperlipidemia   10. Prediabetes - hgba1c 6.2 sev wks ago  11. Acute cystitis without hematuria - incidentally found on CPE today - pt asymptomatic - pan-sensitive E. Coli so start keflex tid x 1 wk and recheck at f/u OV  12.    Vitamin D def - start high dose replacement x 6 mos, then switch to daily otc.  Start daily calcium supp  Orders Placed This Encounter  Procedures  . Urine culture  . Basic metabolic panel    Order Specific Question:  Has the patient fasted?    Answer:  No  . Vit D  25 hydroxy (rtn osteoporosis monitoring)  . Ambulatory referral to Gastroenterology    Referral Priority:  Routine    Referral Type:  Consultation    Referral Reason:  Specialty Services Required    Number of Visits Requested:  1  . POCT UA - Microscopic Only  . POCT urinalysis dipstick    Meds ordered this encounter  Medications  . cephALEXin (KEFLEX) 500 MG capsule    Sig: Take 1 capsule (500 mg total) by mouth 3 (three) times daily.    Dispense:  21 capsule    Refill:  0  . ergocalciferol (VITAMIN D2) 50000 UNITS capsule    Sig: Take 1 capsule (50,000 Units total) by mouth once a week.    Dispense:  12 capsule    Refill:  1    I personally performed the services  described in this documentation, which was scribed in my presence. The recorded information has been reviewed and considered, and addended by me as needed.  Norberto Sorenson, MD MPH

## 2015-07-11 NOTE — Progress Notes (Deleted)
New to establish care here.  Seen 6 wks prior by my colleague Dr. Merla Riches for several episodes of dizziness and HA.  Cbc was nml. Pt was found to be HTN and pre-Dm with hgba1c of 6.2 on 05/27/15 - she planned to loose weight with diet and exercise.  She was started on hctz 12.5 and referred to cardiology for evaluation of a murmur and PVCs in addition to above sxs.  Check for aortic stenosis C/o nocturnal cough so started on flonase and will need CXR if continues At last 6 wks ago she had nml cbc, cmp, tsh. Lipid ldl 124, non-hdl of 141  Former smoker  Recheck bp 130/80 left arm manual

## 2015-07-12 LAB — VITAMIN D 25 HYDROXY (VIT D DEFICIENCY, FRACTURES): VIT D 25 HYDROXY: 20 ng/mL — AB (ref 30–100)

## 2015-07-14 ENCOUNTER — Encounter: Payer: Self-pay | Admitting: Gastroenterology

## 2015-07-14 LAB — URINE CULTURE

## 2015-07-15 ENCOUNTER — Other Ambulatory Visit: Payer: Self-pay

## 2015-07-15 DIAGNOSIS — Z1231 Encounter for screening mammogram for malignant neoplasm of breast: Secondary | ICD-10-CM

## 2015-07-17 ENCOUNTER — Other Ambulatory Visit: Payer: Self-pay

## 2015-07-18 DIAGNOSIS — E559 Vitamin D deficiency, unspecified: Secondary | ICD-10-CM | POA: Insufficient documentation

## 2015-07-18 MED ORDER — ERGOCALCIFEROL 1.25 MG (50000 UT) PO CAPS: 50000.0000 [IU] | ORAL_CAPSULE | ORAL | Status: DC

## 2015-07-18 MED ORDER — CEPHALEXIN 500 MG PO CAPS: 500.0000 mg | ORAL_CAPSULE | Freq: Three times a day (TID) | ORAL | Status: DC

## 2015-07-31 ENCOUNTER — Encounter: Payer: Self-pay | Admitting: Cardiology

## 2015-07-31 ENCOUNTER — Ambulatory Visit (INDEPENDENT_AMBULATORY_CARE_PROVIDER_SITE_OTHER): Payer: 59 | Admitting: Cardiology

## 2015-07-31 VITALS — BP 140/60 | HR 82 | Ht 66.5 in | Wt 270.0 lb

## 2015-07-31 DIAGNOSIS — E785 Hyperlipidemia, unspecified: Secondary | ICD-10-CM

## 2015-07-31 DIAGNOSIS — R010 Benign and innocent cardiac murmurs: Secondary | ICD-10-CM | POA: Diagnosis not present

## 2015-07-31 DIAGNOSIS — R011 Cardiac murmur, unspecified: Secondary | ICD-10-CM | POA: Insufficient documentation

## 2015-07-31 DIAGNOSIS — I10 Essential (primary) hypertension: Secondary | ICD-10-CM

## 2015-07-31 NOTE — Patient Instructions (Signed)
Medication Instructions:  Your physician recommends that you continue on your current medications as directed. Please refer to the Current Medication list given to you today.  Labwork: NONE  Testing/Procedures: Your physician has requested that you have an echocardiogram. Echocardiography is a painless test that uses sound waves to create images of your heart. It provides your doctor with information about the size and shape of your heart and how well your heart's chambers and valves are working. This procedure takes approximately one hour. There are no restrictions for this procedure.  Follow-Up: AS NEEDED    

## 2015-07-31 NOTE — Progress Notes (Signed)
Cardiology Office Note   Date:  07/31/2015   ID:  Penny King, DOB Jan 17, 1951, MRN [id number]  PCP:  No primary care provider on file.  Cardiologist: Cassell Clement MD  Chief Complaint  Patient presents with  . HYPERTENSION      History of Present Illness: Penny King is a 64 y.o. female who presents for cardiology evaluation.  She is being seen by me for the first time today.  She recently had a complete checkup with Dr. Norberto Sorenson.  She was found to be prediabetic and have a heart murmur.  The patient states that in years past she has been told off and on that she has had a heart murmur.  She does not think that she has had it since childhood however.  The patient has a history of high blood pressure of recent onset.  She also has a history of dyslipidemia with elevated LDL level and a history of obesity.  She denies any exertional chest pain or symptoms of angina pectoris.  She has not been having any symptoms of congestive heart failure.  There is no history of dizziness or syncope.  She has not been having any fluid retention or peripheral edema.  She is relatively sedentary. Her family history does not reveal any history of premature coronary disease.  Her father died of old age and her mother died of unknown cause but she does not think it was her heart.    Past Medical History  Diagnosis Date  . Allergy   . Hypertension   . Heart murmur     Past Surgical History  Procedure Laterality Date  . Abdominal hysterectomy       Current Outpatient Prescriptions  Medication Sig Dispense Refill  . ergocalciferol (VITAMIN D2) 50000 UNITS capsule Take 1 capsule (50,000 Units total) by mouth once a week. 12 capsule 1  . fluticasone (FLONASE) 50 MCG/ACT nasal spray Place 2 sprays into both nostrils daily. 9.9 g 6  . hydrochlorothiazide (MICROZIDE) 12.5 MG capsule Take 1 capsule (12.5 mg total) by mouth daily. 90 capsule 1  . Multiple Vitamin (MULTIVITAMIN) tablet Take 1 tablet  by mouth daily.     No current facility-administered medications for this visit.    Allergies:   Review of patient's allergies indicates no known allergies.    Social History:  The patient  reports that she has quit smoking. She does not have any smokeless tobacco history on file.   Family History:  The patient's family history includes Diabetes in her paternal aunt; Hypertension in her father. There is no history of Heart attack or Stroke.    ROS:  Please see the history of present illness.   Otherwise, review of systems are positive for none.   All other systems are reviewed and negative.    PHYSICAL EXAM: VS:  BP 140/60 mmHg  Pulse 82  Ht 5' 6.5" (1.689 m)  Wt 270 lb (122.471 kg)  BMI 42.93 kg/m2 , BMI Body mass index is 42.93 kg/(m^2). GEN: Well nourished, well developed, in no acute distress HEENT: normal Neck: no JVD, carotid bruits, or masses Cardiac: Regular sinus rhythm.  Grade 2/6 systolic ejection murmur at the base radiating toward the carotids.  No diastolic murmur.  No gallop.  No rub.  No pedal edema.  Pedal pulses are present. Respiratory:  clear to auscultation bilaterally, normal work of breathing GI: soft, nontender, nondistended, + BS MS: no deformity or atrophy Skin: warm and dry, no  rash Neuro:  Strength and sensation are intact Psych: euthymic mood, full affect   EKG:  EKG is ordered today. The ekg ordered today demonstrates normal sinus rhythm and biatrial enlargement.  No ischemic changes.   Recent Labs: 08/19/2014: Platelets 256 05/27/2015: ALT 11; Hemoglobin 12.9; TSH 2.071 07/11/2015: BUN 16; Creat 0.66; Potassium 4.3; Sodium 139    Lipid Panel    Component Value Date/Time   CHOL 218* 05/27/2015 1550   TRIG 86 05/27/2015 1550   HDL 77 05/27/2015 1550   CHOLHDL 2.8 05/27/2015 1550   VLDL 17 05/27/2015 1550   LDLCALC 124* 05/27/2015 1550      Wt Readings from Last 3 Encounters:  07/31/15 270 lb (122.471 kg)  07/11/15 271 lb 3.2 oz  (123.016 kg)  05/27/15 272 lb (123.378 kg)        ASSESSMENT AND PLAN: 1.  Systolic heart murmur suggesting aortic valve disease.  She does not appear to be having any current symptoms related to her heart. 2.  Essential hypertension 3.  Obesity 4.  Dyslipidemia 5.  Prediabetes  Disposition: The patient is to continue on her current medications.  She will return for a two-dimensional echocardiogram to evaluate her further.    Current medicines are reviewed at length with the patient today.  The patient does not have concerns regarding medicines.  The following changes have been made:  no change  Labs/ tests ordered today include:  Orders Placed This Encounter  Procedures  . EKG 12-Lead  . ECHOCARDIOGRAM COMPLETE    Many thanks for the opportunity to see this pleasant woman with you.  We will be in touch regarding the results of her echocardiogram.  Recheck here on an as-needed basis.  Karie Schwalbe MD 07/31/2015 5:03 PM    [facility] [address] [place], [place], [place]  [address] Phone: [phone]; Fax: [phone]

## 2015-08-28 ENCOUNTER — Ambulatory Visit
Admission: RE | Admit: 2015-08-28 | Discharge: 2015-08-28 | Disposition: A | Discharge status: Home / Self Care | Payer: 59 | Source: Ambulatory Visit | Attending: Family Medicine | Admitting: Family Medicine

## 2015-08-28 DIAGNOSIS — Z1231 Encounter for screening mammogram for malignant neoplasm of breast: Secondary | ICD-10-CM

## 2015-08-29 ENCOUNTER — Other Ambulatory Visit: Payer: Self-pay | Admitting: Family Medicine

## 2015-08-29 DIAGNOSIS — R928 Other abnormal and inconclusive findings on diagnostic imaging of breast: Secondary | ICD-10-CM

## 2015-09-01 ENCOUNTER — Other Ambulatory Visit: Payer: Self-pay

## 2015-09-02 ENCOUNTER — Other Ambulatory Visit: Payer: Self-pay

## 2015-09-02 ENCOUNTER — Ambulatory Visit (HOSPITAL_COMMUNITY): Payer: 59 | Attending: Cardiology

## 2015-09-02 DIAGNOSIS — R011 Cardiac murmur, unspecified: Secondary | ICD-10-CM | POA: Insufficient documentation

## 2015-09-02 DIAGNOSIS — R010 Benign and innocent cardiac murmurs: Secondary | ICD-10-CM | POA: Diagnosis not present

## 2015-09-02 DIAGNOSIS — Z6841 Body Mass Index (BMI) 40.0 and over, adult: Secondary | ICD-10-CM | POA: Diagnosis not present

## 2015-09-02 DIAGNOSIS — I5189 Other ill-defined heart diseases: Secondary | ICD-10-CM | POA: Diagnosis not present

## 2015-09-02 DIAGNOSIS — E669 Obesity, unspecified: Secondary | ICD-10-CM | POA: Diagnosis not present

## 2015-09-02 DIAGNOSIS — I10 Essential (primary) hypertension: Secondary | ICD-10-CM | POA: Diagnosis not present

## 2015-09-02 DIAGNOSIS — I358 Other nonrheumatic aortic valve disorders: Secondary | ICD-10-CM | POA: Diagnosis not present

## 2015-09-04 ENCOUNTER — Other Ambulatory Visit: Payer: Self-pay

## 2015-09-04 ENCOUNTER — Telehealth: Payer: Self-pay | Admitting: Cardiology

## 2015-09-04 NOTE — Telephone Encounter (Signed)
Advised patient of results.  

## 2015-09-04 NOTE — Telephone Encounter (Signed)
-----   Message from Cassell Clement, MD sent at 09/03/2015  4:50 PM EDT ----- Please report.  The echo shows good LV systolic function EF 60-65 %. The aortic valve was mildly calcified (accounting for the heart murmur) but there was no aortic stenosis. Send copy to PCP.

## 2015-09-04 NOTE — Telephone Encounter (Signed)
Left message to call back  

## 2015-09-04 NOTE — Telephone Encounter (Signed)
New message  ° ° °Patient calling stating someone called her today.  °

## 2015-09-09 ENCOUNTER — Ambulatory Visit (AMBULATORY_SURGERY_CENTER): Payer: Self-pay

## 2015-09-09 ENCOUNTER — Ambulatory Visit
Admission: RE | Admit: 2015-09-09 | Discharge: 2015-09-09 | Disposition: A | Discharge status: Home / Self Care | Payer: 59 | Source: Ambulatory Visit | Attending: Family Medicine | Admitting: Family Medicine

## 2015-09-09 VITALS — Ht 65.0 in | Wt 266.2 lb

## 2015-09-09 DIAGNOSIS — R928 Other abnormal and inconclusive findings on diagnostic imaging of breast: Secondary | ICD-10-CM

## 2015-09-09 DIAGNOSIS — Z1211 Encounter for screening for malignant neoplasm of colon: Secondary | ICD-10-CM

## 2015-09-09 NOTE — Progress Notes (Signed)
No allergies to eggs or soy No home oxygen No diet/weight loss meds No past problems with anesthesia  Refused emmi 

## 2015-09-23 ENCOUNTER — Ambulatory Visit (AMBULATORY_SURGERY_CENTER): Payer: 59 | Admitting: Gastroenterology

## 2015-09-23 ENCOUNTER — Encounter: Payer: Self-pay | Admitting: Gastroenterology

## 2015-09-23 VITALS — BP 104/50 | HR 65 | Temp 97.4°F | Resp 24 | Ht 65.0 in | Wt 266.0 lb

## 2015-09-23 DIAGNOSIS — Z1211 Encounter for screening for malignant neoplasm of colon: Secondary | ICD-10-CM | POA: Diagnosis not present

## 2015-09-23 MED ORDER — SODIUM CHLORIDE 0.9 % IV SOLN: 500.0000 mL | INTRAVENOUS | Status: DC

## 2015-09-23 NOTE — Progress Notes (Signed)
Report to PACU, RN, vss, BBS= Clear.  

## 2015-09-23 NOTE — Op Note (Signed)
 [facility] [address] [place], [id number]   COLONOSCOPY PROCEDURE REPORT  PATIENT: Penny King, Penny King  MR#: [id number] BIRTHDATE: Jun 21, 1951 , 64  yrs. old GENDER: female ENDOSCOPIST: Benancio DeedsSteven P Romy Ipock, MD REFERRED BY: Norberto SorensonEva Shaw MD PROCEDURE DATE:  09/23/2015 PROCEDURE:   Colonoscopy, screening First Screening Colonoscopy - Avg.  risk and is 50 yrs.  old or older - No.  Prior Negative Screening - Now for repeat screening. 10 or more years since last screening  History of Adenoma - Now for follow-up colonoscopy & has been > or = to 3 yrs.  N/King  Polyps removed today? No Recommend repeat exam, <10 yrs? Yes poor prep ASA CLASS:   Class III INDICATIONS:Screening for colonic neoplasia and Colorectal Neoplasm Risk Assessment for this procedure is average risk. MEDICATIONS: Propofol 130 mg IV  DESCRIPTION OF PROCEDURE:   After the risks benefits and alternatives of the procedure were thoroughly explained, informed consent was obtained.  The digital rectal exam revealed no abnormalities of the rectum.   The LB [id number]  endoscope was introduced through the anus and advanced to the distal transverse colon. No adverse events experienced.   The quality of the prep was poor.  The instrument was then slowly withdrawn as the colon was fully examined. Estimated blood loss is zero unless otherwise noted in this procedure report.   COLON FINDINGS: Solid stool was noted in the rectum.  Liquid and solid stool was noted throughout the left colon and on initial examination of the distal transverse colon.  The bowel preparation was poor and inadequate for screening purposes, thus the procedure was aborted.  Retroflexed views revealed no abnormalities. The time to cecum = NA      Withdrawal time = NA        The scope was withdrawn and the procedure completed. COMPLICATIONS: There were no immediate complications.  ENDOSCOPIC IMPRESSION: The bowel preparation was poor and  inadequate for screening purposes, thus the procedure was aborted  RECOMMENDATIONS: Reschedule another colonoscopy with 2 day bowel preparation for screening purposes Resume medications Resume diet  eSigned:  Benancio DeedsSteven P Drew Herman, MD 09/23/2015 10:52 AM   cc: Norberto SorensonEva Shaw MD, the patient

## 2015-09-23 NOTE — Patient Instructions (Signed)
YOU HAD AN ENDOSCOPIC PROCEDURE TODAY AT THE [facility]:   Refer to the procedure report that was given to you for any specific questions about what was found during the examination.  If the procedure report does not answer your questions, please call your gastroenterologist to clarify.  If you requested that your care partner not be given the details of your procedure findings, then the procedure report has been included in a sealed envelope for you to review at your convenience later.  YOU SHOULD EXPECT: Some feelings of bloating in the abdomen. Passage of more gas than usual.  Walking can help get rid of the air that was put into your GI tract during the procedure and reduce the bloating. If you had a lower endoscopy (such as a colonoscopy or flexible sigmoidoscopy) you may notice spotting of blood in your stool or on the toilet paper. If you underwent a bowel prep for your procedure, you may not have a normal bowel movement for a few days.  Please Note:  You might notice some irritation and congestion in your nose or some drainage.  This is from the oxygen used during your procedure.  There is no need for concern and it should clear up in a day or so.  SYMPTOMS TO REPORT IMMEDIATELY:   Following lower endoscopy (colonoscopy or flexible sigmoidoscopy):  Excessive amounts of blood in the stool  Significant tenderness or worsening of abdominal pains  Swelling of the abdomen that is new, acute  Fever of 100F or higher   For urgent or emergent issues, a gastroenterologist can be reached at any hour by calling [phone].   DIET: Your first meal following the procedure should be a small meal and then it is ok to progress to your normal diet. Heavy or fried foods are harder to digest and may make you feel nauseous or bloated.  Likewise, meals heavy in dairy and vegetables can increase bloating.  Drink plenty of fluids but you should avoid alcoholic beverages for 24  hours.  ACTIVITY:  You should plan to take it easy for the rest of today and you should NOT DRIVE or use heavy machinery until tomorrow (because of the sedation medicines used during the test).    FOLLOW UP: Our staff will call the number listed on your records the next business day following your procedure to check on you and address any questions or concerns that you may have regarding the information given to you following your procedure. If we do not reach you, we will leave a message.  However, if you are feeling well and you are not experiencing any problems, there is no need to return our call.  We will assume that you have returned to your regular daily activities without incident.  If any biopsies were taken you will be contacted by phone or by letter within the next 1-3 weeks.  Please call us at [phone] if you have not heard about the biopsies in 3 weeks.    SIGNATURES/CONFIDENTIALITY: You and/or your care partner have signed paperwork which will be entered into your electronic medical record.  These signatures attest to the fact that that the information above on your After Visit Summary has been reviewed and is understood.  Full responsibility of the confidentiality of this discharge information lies with you and/or your care-partner. 

## 2015-09-24 ENCOUNTER — Telehealth: Payer: Self-pay | Admitting: *Deleted

## 2015-09-24 ENCOUNTER — Telehealth: Payer: Self-pay

## 2015-09-24 NOTE — Telephone Encounter (Signed)
Left message on answering machine. 

## 2015-09-24 NOTE — Telephone Encounter (Signed)
Spoke with patient, explained to her that I would like to make appointment for her repeat colonoscopy per MD colon report. She states that she can not until after holidays she can not be off work again. She states she wants appointment for January. Explained to patient I will put her in recall list so we should call her or to call us back in January. She verbalizes understanding.  [facility] # [phone] (Jan.2017).

## 2015-10-20 ENCOUNTER — Telehealth: Payer: Self-pay

## 2015-10-20 NOTE — Telephone Encounter (Signed)
Left message for pt to call back  °

## 2015-10-20 NOTE — Telephone Encounter (Signed)
Absolutely needs to be seen - come to 102 tomorrow to see me.

## 2015-10-20 NOTE — Telephone Encounter (Signed)
Spoke with pt, she states on  thursday started itching real bad and sat night it got really sore. She is now dizzy and states the last time she had this she went to the ED and dx'd with vertigo. Please advise.

## 2015-10-20 NOTE — Telephone Encounter (Signed)
Pt requesting call back from Dr Fulton MoleShaw(did not want to discuss reason with clerical)   Best phone for pt is [phone]

## 2015-10-20 NOTE — Telephone Encounter (Signed)
Spoke with pt, advised message from Dr. Shaw. Pt agreed. 

## 2015-10-21 ENCOUNTER — Ambulatory Visit (INDEPENDENT_AMBULATORY_CARE_PROVIDER_SITE_OTHER): Payer: 59 | Admitting: Family Medicine

## 2015-10-21 VITALS — BP 124/78 | HR 81 | Temp 97.9°F | Resp 18 | Ht 66.5 in | Wt 259.6 lb

## 2015-10-21 DIAGNOSIS — R7303 Prediabetes: Secondary | ICD-10-CM

## 2015-10-21 DIAGNOSIS — N390 Urinary tract infection, site not specified: Secondary | ICD-10-CM | POA: Diagnosis not present

## 2015-10-21 DIAGNOSIS — I10 Essential (primary) hypertension: Secondary | ICD-10-CM | POA: Diagnosis not present

## 2015-10-21 DIAGNOSIS — E669 Obesity, unspecified: Secondary | ICD-10-CM | POA: Diagnosis not present

## 2015-10-21 DIAGNOSIS — H6982 Other specified disorders of Eustachian tube, left ear: Secondary | ICD-10-CM | POA: Diagnosis not present

## 2015-10-21 DIAGNOSIS — E785 Hyperlipidemia, unspecified: Secondary | ICD-10-CM

## 2015-10-21 DIAGNOSIS — R42 Dizziness and giddiness: Secondary | ICD-10-CM | POA: Diagnosis not present

## 2015-10-21 DIAGNOSIS — H60542 Acute eczematoid otitis externa, left ear: Secondary | ICD-10-CM

## 2015-10-21 LAB — POCT URINALYSIS DIP (MANUAL ENTRY)
Bilirubin, UA: NEGATIVE
GLUCOSE UA: NEGATIVE
Ketones, POC UA: NEGATIVE
NITRITE UA: NEGATIVE
Protein Ur, POC: NEGATIVE
Spec Grav, UA: 1.015
Urobilinogen, UA: 1
pH, UA: 6

## 2015-10-21 LAB — COMPREHENSIVE METABOLIC PANEL
ALK PHOS: 74 U/L (ref 33–130)
ALT: 10 U/L (ref 6–29)
AST: 16 U/L (ref 10–35)
Albumin: 4.2 g/dL (ref 3.6–5.1)
BUN: 14 mg/dL (ref 7–25)
CO2: 31 mmol/L (ref 20–31)
Calcium: 9.9 mg/dL (ref 8.6–10.4)
Chloride: 97 mmol/L — ABNORMAL LOW (ref 98–110)
Creat: 0.71 mg/dL (ref 0.50–0.99)
Glucose, Bld: 122 mg/dL — ABNORMAL HIGH (ref 65–99)
POTASSIUM: 4 mmol/L (ref 3.5–5.3)
Sodium: 137 mmol/L (ref 135–146)
TOTAL PROTEIN: 8 g/dL (ref 6.1–8.1)
Total Bilirubin: 0.4 mg/dL (ref 0.2–1.2)

## 2015-10-21 LAB — POCT CBC
Granulocyte percent: 73 %G (ref 37–80)
HCT, POC: 43 % (ref 37.7–47.9)
HEMOGLOBIN: 14.7 g/dL (ref 12.2–16.2)
LYMPH, POC: 2.4 (ref 0.6–3.4)
MCH, POC: 30 pg (ref 27–31.2)
MCHC: 34.2 g/dL (ref 31.8–35.4)
MCV: 87.7 fL (ref 80–97)
MID (CBC): 0.3 (ref 0–0.9)
MPV: 7.4 fL (ref 0–99.8)
POC Granulocyte: 7.3 — AB (ref 2–6.9)
POC LYMPH %: 24.2 % (ref 10–50)
POC MID %: 2.8 % (ref 0–12)
Platelet Count, POC: 289 10*3/uL (ref 142–424)
RBC: 4.91 M/uL (ref 4.04–5.48)
RDW, POC: 13.7 %
WBC: 10 10*3/uL (ref 4.6–10.2)

## 2015-10-21 LAB — HEMOGLOBIN A1C: HEMOGLOBIN A1C: 6.2 % — AB (ref 4.0–6.0)

## 2015-10-21 LAB — POC MICROSCOPIC URINALYSIS (UMFC): MUCUS RE: ABSENT

## 2015-10-21 LAB — POCT GLYCOSYLATED HEMOGLOBIN (HGB A1C): HEMOGLOBIN A1C: 6.2

## 2015-10-21 MED ORDER — CETIRIZINE HCL 10 MG PO TABS: 10.0000 mg | ORAL_TABLET | Freq: Every day | ORAL | Status: DC

## 2015-10-21 MED ORDER — MECLIZINE HCL 25 MG PO TABS: 25.0000 mg | ORAL_TABLET | Freq: Three times a day (TID) | ORAL | Status: DC | PRN

## 2015-10-21 MED ORDER — HYDROCORTISONE-ACETIC ACID 1-2 % OT SOLN: 3.0000 [drp] | Freq: Three times a day (TID) | OTIC | Status: DC

## 2015-10-21 NOTE — Progress Notes (Signed)
Subjective:    Patient ID: Penny King, female    DOB: [birth year], 64 y.o.   MRN: [id number]   CC: Dizziness after ear pain  HPI 64 y/o F presenting with dizziness that started on 11/12 after ear pain that started on 11/10  Ear Pain - 5 days ago left ear got itchy, on 11/10 - Then on 11/12 her ear became very painful such that she could not even touch her ear because it was too painful.    - She had no fevers or chills associated - Her ear pain resolved by 11/14  Dizziness - On 11/12 when her ear became very painful she first noted dizziness - The dizziness was worse with ambulation and she had none with sitting - The dizziness felt as if she were " weaving back and forth" - Denies fevers/chills, N/V/D, denies palpitations, deneis falls, denies lightheadedness - She had a similar episode in 08/2014 where she went to [facility] and was told she had vertigo, and was treated with antivert. However, dizziness symptoms at that time were worse Denies abdominal pain, dysuria  Review of Systems   See HPI for ROS.   Past Medical History  Diagnosis Date  . Allergy   . Hypertension   . Heart murmur    Past Surgical History  Procedure Laterality Date  . Abdominal hysterectomy     OB History    No data available     Social History   Social History  . Marital Status: Married    Spouse Name: N/A  . Number of Children: N/A  . Years of Education: N/A   Occupational History  . Not on file.   Social History Main Topics  . Smoking status: Former Games developer  . Smokeless tobacco: Never Used  . Alcohol Use: No  . Drug Use: No  . Sexual Activity: Not on file   Other Topics Concern  . Not on file   Social History Narrative    Objective:  BP 124/78 mmHg  Pulse 81  Temp(Src) 97.9 F (36.6 C) (Oral)  Resp 18  Ht 5' 6.5" (1.689 m)  Wt 259 lb 9.6 oz (117.754 kg)  BMI 41.28 kg/m2  SpO2 93% Vitals and nursing note reviewed  General: NAD, sitting in chair HEENT: normal oropharynx,  normal ear canals bilaterally, normal TMs with no bulging or erythema, normal auricle and tragus bilaterally, PERRL, no nystagmus Cardiac: RRR, 2/6 systolic murmur Respiratory: CTAB, normal effort Abdomen: soft, nontender, obese Extremities: no edema or cyanosis. WWP. Skin: warm and dry, no rashes noted Neuro: Cranial Nerves II - XII - II - Visual field intact .  III, IV, VI - Extraocular movements intact V - Facial sensation intact bilaterally. VII - Facial movement intact bilaterally. VIII - Hearing intact bilaterally. X - Palate elevates symmetrically, no dysarthria. XI - Chin turning & shoulder shrug intact bilaterally. XII - Tongue protrusion intact.  Motor Strength - The patient's strength was 5/5 in all extremities and pronator drift was absent. Bulk was normal and fasciculations were absent.  Motor Tone - Muscle tone was assessed at the neck and appendages and was normal.  Sensory - Light touch, temperature/pinprick were assessed and were symmetrical.   Coordination - The patient had normal movements in the hands with no ataxia or dysmetria. Tremor was absent.  Gait and Station - ambulation normal, unable to perform heel toe ambulation due to sensation imbalance.   Assessment & Plan:   Ear pain - Likely mild ear infection,  potentially otitis media given subsequent symptoms of dizziness - Likely viral in nature given self limited nature - Resolved  Dizziness - No evidence of central cause of dizziness symptoms given negative Romberg - Likely post infectious labrynthitis - However, given history of asymptomatic bacteriuria UA was collected to rule out UTI as possible cause of dizziness. UA was negative  Dispo - Will plan to discharge with return precautions and close follow up as needed  Aerial Dilley A. Kennon RoundsHaney MD, MS Family Medicine Resident PGY-2 Pager [phone]

## 2015-10-21 NOTE — Progress Notes (Addendum)
Subjective:    Patient ID: Penny King, female    DOB: 1951/10/19, 63 y.o.   MRN: [id number] This chart was scribed for Norberto Sorenson, MD by Littie Deeds, Medical Scribe. This patient was seen in Room 14 and the patient's care was started at 10:18 AM.   Chief Complaint  Patient presents with   Dizziness    started last Saturday after experiencing left ear pain.     HPI HPI Comments: Penny King is a 64 y.o. female who presents to the [facility] complaining of left ear pain with itchiness that started 5 days ago. She has had associated dizziness. Patient was initially evaluated by Dr. Velora Heckler, Family Medicine resident; refer to her note.   Patient has an appointment in a few weeks, but is agreeable to have blood work done today instead. She is fasting today. She has cut back on sweets and has been walking more; as a result, she has lost some weight since last visit. Patient notes that she has to reschedule her colonoscopy because her prep was not successfully done. She is aware of 52-month mammogram follow-up.  Wt Readings from Last 3 Encounters:  10/21/15 259 lb 9.6 oz (117.754 kg)  09/23/15 266 lb (120.657 kg)  09/09/15 266 lb 3.2 oz (120.748 kg)    Depression screen [facility] 2/9 10/21/2015 07/11/2015 05/27/2015  Decreased Interest 0 0 0  Down, Depressed, Hopeless 0 0 0  PHQ - 2 Score 0 0 0   Past Medical History  Diagnosis Date   Allergy    Hypertension    Heart murmur    Current Outpatient Prescriptions on File Prior to Visit  Medication Sig Dispense Refill   fluticasone (FLONASE) 50 MCG/ACT nasal spray Place 2 sprays into both nostrils daily. 9.9 g 6   hydrochlorothiazide (MICROZIDE) 12.5 MG capsule Take 1 capsule (12.5 mg total) by mouth daily. 90 capsule 1   OVER THE COUNTER MEDICATION Women's 50+ vitamin     No current facility-administered medications on file prior to visit.   No Known Allergies   Review of Systems  Constitutional: Positive  for activity change, appetite change and fatigue. Negative for fever, chills and unexpected weight change.  HENT: Positive for ear pain. Negative for ear discharge, facial swelling and hearing loss.   Skin: Negative for rash and wound.  Neurological: Positive for dizziness and light-headedness. Negative for facial asymmetry and headaches.  Hematological: Negative for adenopathy.  Psychiatric/Behavioral: Negative for dysphoric mood.       Objective:  BP 124/78 mmHg   Pulse 81   Temp(Src) 97.9 F (36.6 C) (Oral)   Resp 18   Ht 5' 6.5" (1.689 m)   Wt 259 lb 9.6 oz (117.754 kg)   BMI 41.28 kg/m2   SpO2 93%  Physical Exam  Constitutional: She is oriented to person, place, and time. She appears well-developed and well-nourished. No distress.  HENT:  Head: Normocephalic and atraumatic.  Right Ear: Tympanic membrane normal.  Left Ear: Tympanic membrane is injected.  Mouth/Throat: Oropharynx is clear and moist. No oropharyngeal exudate.  Eyes: Pupils are equal, round, and reactive to light.  Neck: Neck supple.  Cardiovascular: Normal rate.   Pulmonary/Chest: Effort normal.  Musculoskeletal: She exhibits no edema.  Neurological: She is alert and oriented to person, place, and time. No cranial nerve deficit.  Skin: Skin is warm and dry. No rash noted.  Psychiatric: She has a normal mood and affect. Her behavior is normal.  Nursing note and vitals reviewed.         Assessment & Plan:   1. Essential hypertension   2. Hyperlipidemia   3. Prediabetes   4. Obesity   5. Dizziness and giddiness   6. UTI (lower urinary tract infection)   7. Eustachian tube dysfunction, left   8. Dermatitis of ear canal, left      Orders Placed This Encounter  Procedures   Comprehensive metabolic panel   POCT urinalysis dipstick   POCT Microscopic Urinalysis ([facility])   POCT CBC   POCT glycosylated hemoglobin (Hb A1C)    Meds ordered this encounter  Medications   cetirizine (ZYRTEC) 10 MG  tablet    Sig: Take 1 tablet (10 mg total) by mouth at bedtime.    Dispense:  30 tablet    Refill:  11   acetic acid-hydrocortisone (VOSOL-HC) otic solution    Sig: Place 3 drops into the left ear 3 (three) times daily. As needed for canal pain and itching    Dispense:  10 mL    Refill:  0   meclizine (ANTIVERT) 25 MG tablet    Sig: Take 1 tablet (25 mg total) by mouth 3 (three) times daily as needed for dizziness.    Dispense:  30 tablet    Refill:  0    I personally performed the services described in this documentation, which was scribed in my presence. The recorded information has been reviewed and considered, and addended by me as needed.  Norberto Sorenson, MD MPH  By signing my name below, I, Littie Deeds, attest that this documentation has been prepared under the direction and in the presence of Norberto Sorenson, MD.  Electronically Signed: Littie Deeds, Medical Scribe. 10/21/2015. 10:18 AM.  Results for orders placed or performed in visit on 10/21/15  Comprehensive metabolic panel  Result Value Ref Range   Sodium 137 135 - 146 mmol/L   Potassium 4.0 3.5 - 5.3 mmol/L   Chloride 97 (L) 98 - 110 mmol/L   CO2 31 20 - 31 mmol/L   Glucose, Bld 122 (H) 65 - 99 mg/dL   BUN 14 7 - 25 mg/dL   Creat 8.41 3.24 - 4.01 mg/dL   Total Bilirubin 0.4 0.2 - 1.2 mg/dL   Alkaline Phosphatase 74 33 - 130 U/L   AST 16 10 - 35 U/L   ALT 10 6 - 29 U/L   Total Protein 8.0 6.1 - 8.1 g/dL   Albumin 4.2 3.6 - 5.1 g/dL   Calcium 9.9 8.6 - 02.7 mg/dL  POCT urinalysis dipstick  Result Value Ref Range   Color, UA yellow yellow   Clarity, UA clear clear   Glucose, UA negative negative   Bilirubin, UA negative negative   Ketones, POC UA negative negative   Spec Grav, UA 1.015    Blood, UA trace-intact (A) negative   pH, UA 6.0    Protein Ur, POC negative negative   Urobilinogen, UA 1.0    Nitrite, UA Negative Negative   Leukocytes, UA Trace (A) Negative  POCT Microscopic Urinalysis ([facility])  Result Value Ref  Range   WBC,UR,HPF,POC None None WBC/hpf   RBC,UR,HPF,POC None None RBC/hpf   Bacteria Few (A) None, Too numerous to count   Mucus Absent Absent   Epithelial Cells, UR Per Microscopy Few (A) None, Too numerous to count cells/hpf  POCT CBC  Result Value Ref Range   WBC 10.0 4.6 - 10.2 K/uL   Lymph, poc 2.4 0.6 -  3.4   POC LYMPH PERCENT 24.2 10 - 50 %L   MID (cbc) 0.3 0 - 0.9   POC MID % 2.8 0 - 12 %M   POC Granulocyte 7.3 (A) 2 - 6.9   Granulocyte percent 73.0 37 - 80 %G   RBC 4.91 4.04 - 5.48 M/uL   Hemoglobin 14.7 12.2 - 16.2 g/dL   HCT, POC 16.143.0 09.637.7 - 47.9 %   MCV 87.7 80 - 97 fL   MCH, POC 30.0 27 - 31.2 pg   MCHC 34.2 31.8 - 35.4 g/dL   RDW, POC 04.513.7 %   Platelet Count, POC 289 142 - 424 K/uL   MPV 7.4 0 - 99.8 fL  POCT glycosylated hemoglobin (Hb A1C)  Result Value Ref Range   Hemoglobin A1C 6.2

## 2015-10-21 NOTE — Patient Instructions (Addendum)
Great job on the weight loss!!!  Keep it up!!!!  Barotitis Media Barotitis media is inflammation of your middle  ear. This occurs when the auditory tube (eustachian tube) leading from the back of your nose (nasopharynx) to your eardrum is blocked. This blockage may result from a cold, environmental allergies, or an upper respiratory infection. Unresolved barotitis media may lead to damage or hearing loss (barotrauma), which may become permanent. HOME CARE INSTRUCTIONS   Use medicines as recommended by your health care provider. Over-the-counter medicines will help unblock the canal and can help during times of air travel.  Do not put anything into your ears to clean or unplug them. Eardrops will not be helpful.  Do not swim, dive, or fly until your health care provider says it is all right to do so. If these activities are necessary, chewing gum with frequent, forceful swallowing may help. It is also helpful to hold your nose and gently blow to pop your ears for equalizing pressure changes. This forces air into the eustachian tube.  Only take over-the-counter or prescription medicines for pain, discomfort, or fever as directed by your health care provider.  A decongestant may be helpful in decongesting the middle ear and make pressure equalization easier. SEEK MEDICAL CARE IF:  You experience a serious form of dizziness in which you feel as if the room is spinning and you feel nauseated (vertigo).  Your symptoms only involve one ear. SEEK IMMEDIATE MEDICAL CARE IF:   You develop a severe headache, dizziness, or severe ear pain.  You have bloody or pus-like drainage from your ears.  You develop a fever.  Your problems do not improve or become worse. MAKE SURE YOU:   Understand these instructions.  Will watch your condition.  Will get help right away if you are not doing well or get worse.   This information is not intended to replace advice given to you by your health care  provider. Make sure you discuss any questions you have with your health care provider.   Document Released: 11/19/2000 Document Revised: 09/12/2013 Document Reviewed: 06/19/2013 Elsevier Interactive Patient Education [employer]2016 Elsevier Inc.

## 2015-10-27 ENCOUNTER — Encounter: Payer: Self-pay | Admitting: Family Medicine

## 2015-11-03 ENCOUNTER — Encounter: Payer: Self-pay | Admitting: Family Medicine

## 2015-11-13 ENCOUNTER — Ambulatory Visit: Payer: Self-pay | Admitting: Family Medicine

## 2015-12-03 ENCOUNTER — Other Ambulatory Visit: Payer: Self-pay | Admitting: Internal Medicine

## 2015-12-05 ENCOUNTER — Other Ambulatory Visit: Payer: Self-pay | Admitting: Internal Medicine

## 2015-12-05 ENCOUNTER — Telehealth: Payer: Self-pay | Admitting: Family Medicine

## 2015-12-05 MED ORDER — HYDROCHLOROTHIAZIDE 12.5 MG PO CAPS: ORAL_CAPSULE | ORAL | Status: DC

## 2015-12-05 NOTE — Telephone Encounter (Signed)
Patient stated she

## 2015-12-05 NOTE — Telephone Encounter (Signed)
Patient request for a refill of Hydrochlorothiazide 12.5 MG Capsule. [facility]. [phone].

## 2015-12-05 NOTE — Telephone Encounter (Signed)
This was sent already. I re-sent.

## 2016-01-02 ENCOUNTER — Encounter: Payer: Self-pay | Admitting: Gastroenterology

## 2018-01-22 ENCOUNTER — Encounter (HOSPITAL_COMMUNITY): Payer: Self-pay | Admitting: Emergency Medicine

## 2018-01-22 ENCOUNTER — Emergency Department (HOSPITAL_COMMUNITY): Payer: 59

## 2018-01-22 ENCOUNTER — Emergency Department (HOSPITAL_COMMUNITY)
Admission: EM | Admit: 2018-01-22 | Discharge: 2018-01-22 | Disposition: A | Discharge status: Home / Self Care | Payer: 59 | Attending: Emergency Medicine | Admitting: Emergency Medicine

## 2018-01-22 ENCOUNTER — Other Ambulatory Visit: Payer: Self-pay

## 2018-01-22 DIAGNOSIS — H9202 Otalgia, left ear: Secondary | ICD-10-CM | POA: Diagnosis not present

## 2018-01-22 DIAGNOSIS — Z87891 Personal history of nicotine dependence: Secondary | ICD-10-CM | POA: Diagnosis not present

## 2018-01-22 DIAGNOSIS — R220 Localized swelling, mass and lump, head: Secondary | ICD-10-CM | POA: Diagnosis not present

## 2018-01-22 DIAGNOSIS — L03211 Cellulitis of face: Secondary | ICD-10-CM | POA: Insufficient documentation

## 2018-01-22 DIAGNOSIS — I10 Essential (primary) hypertension: Secondary | ICD-10-CM | POA: Diagnosis not present

## 2018-01-22 LAB — I-STAT CHEM 8, ED
BUN: 20 mg/dL (ref 6–20)
CHLORIDE: 102 mmol/L (ref 101–111)
Calcium, Ion: 1.11 mmol/L — ABNORMAL LOW (ref 1.15–1.40)
Creatinine, Ser: 0.8 mg/dL (ref 0.44–1.00)
Glucose, Bld: 109 mg/dL — ABNORMAL HIGH (ref 65–99)
HCT: 46 % (ref 36.0–46.0)
Hemoglobin: 15.6 g/dL — ABNORMAL HIGH (ref 12.0–15.0)
POTASSIUM: 4.2 mmol/L (ref 3.5–5.1)
SODIUM: 138 mmol/L (ref 135–145)
TCO2: 30 mmol/L (ref 22–32)

## 2018-01-22 LAB — CBC WITH DIFFERENTIAL/PLATELET
BASOS ABS: 0 10*3/uL (ref 0.0–0.1)
Basophils Relative: 0 %
EOS ABS: 0.1 10*3/uL (ref 0.0–0.7)
EOS PCT: 1 %
HCT: 41.7 % (ref 36.0–46.0)
Hemoglobin: 13.9 g/dL (ref 12.0–15.0)
LYMPHS PCT: 30 %
Lymphs Abs: 2.3 10*3/uL (ref 0.7–4.0)
MCH: 30.8 pg (ref 26.0–34.0)
MCHC: 33.3 g/dL (ref 30.0–36.0)
MCV: 92.3 fL (ref 78.0–100.0)
Monocytes Absolute: 0.9 10*3/uL (ref 0.1–1.0)
Monocytes Relative: 12 %
Neutro Abs: 4.3 10*3/uL (ref 1.7–7.7)
Neutrophils Relative %: 57 %
PLATELETS: 270 10*3/uL (ref 150–400)
RBC: 4.52 MIL/uL (ref 3.87–5.11)
RDW: 13.5 % (ref 11.5–15.5)
WBC: 7.7 10*3/uL (ref 4.0–10.5)

## 2018-01-22 MED ORDER — IOPAMIDOL (ISOVUE-300) INJECTION 61%
INTRAVENOUS | Status: AC
  Filled 2018-01-22: qty 75

## 2018-01-22 MED ORDER — VALACYCLOVIR HCL 1 G PO TABS: 1000.0000 mg | ORAL_TABLET | Freq: Three times a day (TID) | ORAL | 0 refills | Status: AC

## 2018-01-22 MED ORDER — DIPHENHYDRAMINE HCL 50 MG/ML IJ SOLN
25.0000 mg | Freq: Once | INTRAMUSCULAR | Status: AC
  Administered 2018-01-22: 25 mg via INTRAVENOUS
  Filled 2018-01-22: qty 1

## 2018-01-22 MED ORDER — TETRACAINE HCL 0.5 % OP SOLN
2.0000 [drp] | Freq: Once | OPHTHALMIC | Status: AC
  Administered 2018-01-22: 2 [drp] via OPHTHALMIC
  Filled 2018-01-22: qty 4

## 2018-01-22 MED ORDER — FLUORESCEIN SODIUM 1 MG OP STRP
1.0000 | ORAL_STRIP | Freq: Once | OPHTHALMIC | Status: AC
Start: 2018-01-22 — End: 2018-01-22
  Administered 2018-01-22: 1 via OPHTHALMIC
  Filled 2018-01-22: qty 1

## 2018-01-22 MED ORDER — CEPHALEXIN 500 MG PO CAPS: 500.0000 mg | ORAL_CAPSULE | Freq: Four times a day (QID) | ORAL | 0 refills | Status: DC

## 2018-01-22 MED ORDER — IOPAMIDOL (ISOVUE-300) INJECTION 61%
75.0000 mL | Freq: Once | INTRAVENOUS | Status: AC | PRN
  Administered 2018-01-22: 75 mL via INTRAVENOUS

## 2018-01-22 MED ORDER — SULFAMETHOXAZOLE-TRIMETHOPRIM 800-160 MG PO TABS: 1.0000 | ORAL_TABLET | Freq: Two times a day (BID) | ORAL | 0 refills | Status: AC

## 2018-01-22 NOTE — ED Notes (Signed)
Pt has swelling/redness/warmness on the left side of the face that is pruritic onset Wednesday.

## 2018-01-22 NOTE — ED Triage Notes (Signed)
Pt reports having swelling to left eye and cheek. Pt reports having difficulty seeing due to swelling. Denies any difficulty breathing or airway obstruction. Pt states redness and swelling started on 01/18/18

## 2018-01-22 NOTE — ED Provider Notes (Signed)
[facility]   CSN: [id number] Arrival date & time: 01/22/18  0631     History   Chief Complaint Chief Complaint  Patient presents with  . Facial Swelling    HPI Penny King is a 67 y.o. female.  The history is provided by the patient and medical records. No language interpreter was used.   Penny King is a 67 y.o. female  with a PMH of HTN, HLD who presents to the Emergency Department complaining of facial swelling which began 4-5 days ago. Patient reports pain and swelling initially began with the left ear. Yesterday, she reports pain and swelling of the left cheek and under the left eye. She reports no visual changes, but that the area around her eye has swollen, causing her to not be able to keep the eye open, therefore causing her difficulty seeing.  He reports a very remote history of similar over 20 years ago which also started with left ear and progressed to the face.  She is unsure what caused this, but thought it was some type of allergic reaction.  She denies any known exposures recently.  No fever or chills.  No neck pain, sore throat or headache.  Past Medical History:  Diagnosis Date  . Allergy   . Heart murmur   . Hypertension     Patient Active Problem List   Diagnosis Date Noted  . Heart murmur previously undiagnosed 07/31/2015  . Vitamin D deficiency 07/18/2015  . Hyperlipidemia 07/11/2015  . Obesity 07/11/2015  . Essential hypertension 07/11/2015  . Prediabetes 07/11/2015    Past Surgical History:  Procedure Laterality Date  . ABDOMINAL HYSTERECTOMY      OB History    No data available       Home Medications    Prior to Admission medications   Medication Sig Start Date End Date Taking? Authorizing Provider  acetaminophen (TYLENOL) 500 MG tablet Take 1,000 mg by mouth every 6 (six) hours as needed for mild pain.   Yes [provider]  glucosamine-chondroitin 500-400 MG tablet Take 1  tablet by mouth 3 (three) times daily.   Yes [provider]  Multiple Vitamin (MULTIVITAMIN WITH MINERALS) TABS tablet Take 1 tablet by mouth daily.   Yes [provider]  acetic acid-hydrocortisone (VOSOL-HC) otic solution Place 3 drops into the left ear 3 (three) times daily. As needed for canal pain and itching Patient not taking: Reported on 01/22/2018 10/21/15   Sherren MochaShaw, Eva N, MD  cephALEXin (KEFLEX) 500 MG capsule Take 1 capsule (500 mg total) by mouth 4 (four) times daily. 01/22/18   Berda Shelvin, Chase PicketJaime Pilcher, PA-C  cetirizine (ZYRTEC) 10 MG tablet Take 1 tablet (10 mg total) by mouth at bedtime. Patient not taking: Reported on 01/22/2018 10/21/15   Sherren MochaShaw, Eva N, MD  fluticasone [facility](FLONASE) 50 MCG/ACT nasal spray Place 2 sprays into both nostrils daily. Patient not taking: Reported on 01/22/2018 05/27/15   Tonye Pearsonoolittle, Robert P, MD  hydrochlorothiazide (MICROZIDE) 12.5 MG capsule TAKE 1 CAPSULE BY MOUTH ONCE DAILY Patient not taking: Reported on 01/22/2018 12/05/15   Sherren MochaShaw, Eva N, MD  meclizine (ANTIVERT) 25 MG tablet Take 1 tablet (25 mg total) by mouth 3 (three) times daily as needed for dizziness. Patient not taking: Reported on 01/22/2018 10/21/15   Sherren MochaShaw, Eva N, MD  sulfamethoxazole-trimethoprim (BACTRIM DS,SEPTRA DS) 800-160 MG tablet Take 1 tablet by mouth 2 (two) times daily for 7 days. 01/22/18 01/29/18  Daijah Scrivens, Chase PicketJaime Pilcher,  PA-C  valACYclovir (VALTREX) 1000 MG tablet Take 1 tablet (1,000 mg total) by mouth 3 (three) times daily for 7 days. 01/22/18 01/29/18  Huntleigh Doolen, Chase Picket, PA-C    Family History Family History  Problem Relation Age of Onset  . Hypertension Father   . Diabetes Paternal Aunt   . Heart attack Neg Hx   . Stroke Neg Hx   . Colon cancer Neg Hx     Social History Social History   Tobacco Use  . Smoking status: Former Games developer  . Smokeless tobacco: Never Used  Substance Use Topics  . Alcohol use: No    Alcohol/week: 0.0 oz  . Drug use: No     Allergies     Patient has no known allergies.   Review of Systems Review of Systems  HENT: Positive for ear pain and facial swelling. Negative for hearing loss.   Eyes: Negative for photophobia, pain, discharge, redness, itching and visual disturbance.  All other systems reviewed and are negative.    Physical Exam Updated Vital Signs BP (!) 164/80 (BP Location: Left Arm)   Pulse 72   Temp 97.7 F (36.5 C) (Oral)   Resp 18   Ht 5\' 5"  (1.651 m)   Wt 113.4 kg (250 lb)   SpO2 96%   BMI 41.60 kg/m   Physical Exam  Constitutional: She is oriented to person, place, and time. She appears well-developed and well-nourished. No distress.  HENT:  Head: Normocephalic and atraumatic.  Erythema and swelling to left axillary region and just under the left eye.  No lesions on fluorescein eye exam.  Left ear canal edematous.  Preauricular lymphadenopathy present.  No vesicles noted.  Eyes: Conjunctivae and EOM are normal. Pupils are equal, round, and reactive to light. Right eye exhibits no discharge. Left eye exhibits no discharge.  Neck: Normal range of motion. Neck supple.  Cardiovascular: Normal rate, regular rhythm and normal heart sounds.  No murmur heard. Pulmonary/Chest: Effort normal and breath sounds normal. No respiratory distress.  Neurological: She is alert and oriented to person, place, and time.  Skin: Skin is warm and dry.  Nursing note and vitals reviewed.    ED Treatments / Results  Labs (all labs ordered are listed, but only abnormal results are displayed) Labs Reviewed  I-STAT CHEM 8, ED - Abnormal; Notable for the following components:      Result Value   Glucose, Bld 109 (*)    Calcium, Ion 1.11 (*)    Hemoglobin 15.6 (*)    All other components within normal limits  CBC WITH DIFFERENTIAL/PLATELET    EKG  EKG Interpretation None       Radiology Ct Maxillofacial W Contrast  Result Date: 01/22/2018 CLINICAL DATA:  Swelling left eye and cheek.  Redness. EXAM: CT  MAXILLOFACIAL WITH CONTRAST TECHNIQUE: Multidetector CT imaging of the maxillofacial structures was performed with intravenous contrast. Multiplanar CT image reconstructions were also generated. CONTRAST:  75mL ISOVUE-300 IOPAMIDOL (ISOVUE-300) INJECTION 61% COMPARISON:  None. FINDINGS: Osseous: Negative for fracture. No acute bony abnormality. Patient is edentulous. Orbits: Mild soft tissue swelling of the upper and lower eyelid on the left. No edema within the orbit. Negative for abscess or mass in the orbit Sinuses: Paranasal sinuses clear without mucosal edema or air-fluid level. Soft tissues: Edema in the skin and subcutaneous tissues of left maxilla and left eyelid. No soft tissue mass or abscess. Limited intracranial: Negative IMPRESSION: Soft tissue thickening of the skin and subcutaneous fat of the left eye  and face consistent with cellulitis. No abscess. Electronically Signed   By: Marlan Palau M.D.   On: 01/22/2018 09:47    Procedures Procedures (including critical care time)  Medications Ordered in ED Medications  iopamidol (ISOVUE-300) 61 % injection (not administered)  diphenhydrAMINE (BENADRYL) injection 25 mg (25 mg Intravenous Given 01/22/18 0842)  fluorescein ophthalmic strip 1 strip (1 strip Left Eye Given 01/22/18 0842)  tetracaine (PONTOCAINE) 0.5 % ophthalmic solution 2 drop (2 drops Left Eye Given 01/22/18 0843)  iopamidol (ISOVUE-300) 61 % injection 75 mL (75 mLs Intravenous Contrast Given 01/22/18 0924)     Initial Impression / Assessment and Plan / ED Course  I have reviewed the triage vital signs and the nursing notes.  Pertinent labs & imaging results that were available during my care of the patient were reviewed by me and considered in my medical decision making (see chart for details).    Penny King is a 67 y.o. female who presents to ED for left-sided facial pain and swelling x 4-5 days. Initially started with the left ear, but has now progressed to cheek.  Exam,  patient is afebrile, hemodynamically stable with no visual changes.  She does have tenderness, erythema and swelling to the left cheek and under the left eye.  Left ear canal is also edematous.  No dendritic lesions appreciated on eye exam.  No vesicles appreciated, however given distribution & itching/burning prodromal stage, Ramsay Hunt still very much a possibility.  Labs reviewed and reassuring.  CT obtained showing facial cellulitis.  No orbital cellulitis or abscess.  Will treat cellulitis with Bactrim and Keflex.  Will also cover with Valtrex for possibility of Ramsay Hunt.  Patient does work at the cancer center.  Given that she is around immunocompromised patient routinely, will provide work note and have her follow-up with PCP for clearance to return to work.  If vesicles develop, I would have very low threshold to keep her out of work until symptoms completely clear.   Patient seen by and discussed with Dr. Erma Heritage who agrees with treatment plan.   Final Clinical Impressions(s) / ED Diagnoses   Final diagnoses:  Facial cellulitis    ED Discharge Orders        Ordered    cephALEXin (KEFLEX) 500 MG capsule  4 times daily     01/22/18 1014    sulfamethoxazole-trimethoprim (BACTRIM DS,SEPTRA DS) 800-160 MG tablet  2 times daily     01/22/18 1014    valACYclovir (VALTREX) 1000 MG tablet  3 times daily     01/22/18 1014       Corbyn Steedman, Chase Picket, PA-C 01/22/18 1107    Shaune Pollack, MD 01/22/18 1952

## 2018-01-22 NOTE — ED Notes (Signed)
Bed: WA04 Expected date:  Expected time:  Means of arrival:  Comments: 

## 2018-01-22 NOTE — Discharge Instructions (Signed)
It was my pleasure taking care of you today!   Please take all of your medications as directed. Because you are on three medications, you may develop abdominal discomfort or diarrhea from the antibiotic. You may help offset this with probiotics which you can buy or get in yogurt. I would recommend eating yogurt daily or starting a probiotic to help with upset stomach.   Please call your doctor in the morning to schedule a follow up appointment on Tuesday or Wednesday. You will need to be cleared to return to work.   Return to ER for fevers, visual changes, new or worsening symptoms, any additional changes.

## 2018-01-26 ENCOUNTER — Encounter (HOSPITAL_COMMUNITY): Payer: Self-pay | Admitting: Emergency Medicine

## 2018-01-26 ENCOUNTER — Emergency Department (HOSPITAL_COMMUNITY)
Admission: EM | Admit: 2018-01-26 | Discharge: 2018-01-26 | Disposition: A | Discharge status: Home / Self Care | Payer: 59 | Attending: Emergency Medicine | Admitting: Emergency Medicine

## 2018-01-26 ENCOUNTER — Other Ambulatory Visit: Payer: Self-pay

## 2018-01-26 DIAGNOSIS — Z87891 Personal history of nicotine dependence: Secondary | ICD-10-CM | POA: Diagnosis not present

## 2018-01-26 DIAGNOSIS — I10 Essential (primary) hypertension: Secondary | ICD-10-CM | POA: Diagnosis not present

## 2018-01-26 DIAGNOSIS — Z09 Encounter for follow-up examination after completed treatment for conditions other than malignant neoplasm: Secondary | ICD-10-CM

## 2018-01-26 DIAGNOSIS — L03211 Cellulitis of face: Secondary | ICD-10-CM | POA: Diagnosis not present

## 2018-01-26 NOTE — ED Provider Notes (Signed)
[facility]-EMERGENCY DEPT Provider Note   CSN: [id number] Arrival date & time: 01/26/18  4098     History   Chief Complaint Chief Complaint  Patient presents with  . Follow-up    HPI Penny King is a 67 y.o. female with a past medical history of hypertension, who presents to ED for evaluation of recheck of left eye symptoms.  She was seen and evaluated here about 4 days ago and diagnosed with facial cellulitis confirmed via CT.  Unable to rule out Ramsay Hunt syndrome.  She was treated with Keflex, Bactrim and Valtrex and returns here for recheck.  She reports compliance with these medications and reports complete resolution of her symptoms.  She continues to deny any vision changes, ear pain, vesicular lesions, foreign body sensation, trauma to eye, fevers, facial swelling.  HPI  Past Medical History:  Diagnosis Date  . Allergy   . Heart murmur   . Hypertension     Patient Active Problem List   Diagnosis Date Noted  . Heart murmur previously undiagnosed 07/31/2015  . Vitamin D deficiency 07/18/2015  . Hyperlipidemia 07/11/2015  . Obesity 07/11/2015  . Essential hypertension 07/11/2015  . Prediabetes 07/11/2015    Past Surgical History:  Procedure Laterality Date  . ABDOMINAL HYSTERECTOMY      OB History    No data available       Home Medications    Prior to Admission medications   Medication Sig Start Date End Date Taking? Authorizing Provider  acetaminophen (TYLENOL) 500 MG tablet Take 1,000 mg by mouth every 6 (six) hours as needed for mild pain.    [provider]  acetic acid-hydrocortisone (VOSOL-HC) otic solution Place 3 drops into the left ear 3 (three) times daily. As needed for canal pain and itching Patient not taking: Reported on 01/22/2018 10/21/15   Sherren Mocha, MD  cephALEXin (KEFLEX) 500 MG capsule Take 1 capsule (500 mg total) by mouth 4 (four) times daily. 01/22/18   Ward, Chase Picket, PA-C  cetirizine (ZYRTEC)  10 MG tablet Take 1 tablet (10 mg total) by mouth at bedtime. Patient not taking: Reported on 01/22/2018 10/21/15   Sherren Mocha, MD  fluticasone [facility]) 50 MCG/ACT nasal spray Place 2 sprays into both nostrils daily. Patient not taking: Reported on 01/22/2018 05/27/15   Tonye Pearson, MD  glucosamine-chondroitin 500-400 MG tablet Take 1 tablet by mouth 3 (three) times daily.    [provider]  hydrochlorothiazide (MICROZIDE) 12.5 MG capsule TAKE 1 CAPSULE BY MOUTH ONCE DAILY Patient not taking: Reported on 01/22/2018 12/05/15   Sherren Mocha, MD  meclizine (ANTIVERT) 25 MG tablet Take 1 tablet (25 mg total) by mouth 3 (three) times daily as needed for dizziness. Patient not taking: Reported on 01/22/2018 10/21/15   Sherren Mocha, MD  Multiple Vitamin (MULTIVITAMIN WITH MINERALS) TABS tablet Take 1 tablet by mouth daily.    [provider]  sulfamethoxazole-trimethoprim (BACTRIM DS,SEPTRA DS) 800-160 MG tablet Take 1 tablet by mouth 2 (two) times daily for 7 days. 01/22/18 01/29/18  Ward, Chase Picket, PA-C  valACYclovir (VALTREX) 1000 MG tablet Take 1 tablet (1,000 mg total) by mouth 3 (three) times daily for 7 days. 01/22/18 01/29/18  Ward, Chase Picket, PA-C    Family History Family History  Problem Relation Age of Onset  . Hypertension Father   . Diabetes Paternal Aunt   . Heart attack Neg Hx   . Stroke Neg Hx   .  Colon cancer Neg Hx     Social History Social History   Tobacco Use  . Smoking status: Former Games developermoker  . Smokeless tobacco: Never Used  Substance Use Topics  . Alcohol use: No    Alcohol/week: 0.0 oz  . Drug use: No     Allergies   Patient has no known allergies.   Review of Systems Review of Systems  Constitutional: Negative for chills and fever.  HENT: Negative for ear pain and facial swelling.   Eyes: Negative for photophobia, pain, discharge, redness, itching and visual disturbance.     Physical Exam Updated Vital Signs BP (!) 156/92  (BP Location: Left Arm)   Pulse 89   Temp 98.5 F (36.9 C) (Oral)   Resp 18   SpO2 96%   Physical Exam  Constitutional: She appears well-developed and well-nourished. No distress.  Nontoxic appearing and in no acute distress.  HENT:  Head: Normocephalic and atraumatic.  Right Ear: Tympanic membrane normal.  Left Ear: Tympanic membrane normal.  Eyes: Conjunctivae are normal. Pupils are equal, round, and reactive to light. Right eye exhibits no discharge. Left eye exhibits no discharge. Right conjunctiva is not injected. Left conjunctiva is not injected. No scleral icterus. Right eye exhibits normal extraocular motion. Left eye exhibits normal extraocular motion.  Left eye with no eyelid swelling or erythema or tenderness to palpation, no drainage noted.  No foreign bodies noted.  No pain with EOMs.  No chemosis, proptosis, or consensual photophobia. No vesicular or other lesions noted.  Neck: Normal range of motion.  Pulmonary/Chest: Effort normal. No respiratory distress.  Neurological: She is alert.  Skin: No rash noted. She is not diaphoretic.  Psychiatric: She has a normal mood and affect.  Nursing note and vitals reviewed.    ED Treatments / Results  Labs (all labs ordered are listed, but only abnormal results are displayed) Labs Reviewed - No data to display  EKG  EKG Interpretation None       Radiology No results found.  Procedures Procedures (including critical care time)  Medications Ordered in ED Medications - No data to display   Initial Impression / Assessment and Plan / ED Course  I have reviewed the triage vital signs and the nursing notes.  Pertinent labs & imaging results that were available during my care of the patient were reviewed by me and considered in my medical decision making (see chart for details).     Patient presents to ED for evaluation of recheck of left eye cellulitis.  She was treated empirically with Bactrim, Keflex and Valtrex.   She reports compliance with these medications and denies any worsening of her symptoms but does report complete resolution of her symptoms.  Denies any vision changes, foreign body sensation, swelling around eye, fever, facial pain.  On physical exam there is no erythema, swelling or lesions noted.  Previous provider note states that patient will not be able to return to work if vesicles are present.  I believe that she is medically cleared to return to work.  I did advise her to continue and complete her course of her medications regardless of symptom improvement to prevent any worse or returning infection. Patient appears stable for discharge at this time. Strict return precautions given.  Portions of this note were generated with Scientist, clinical (histocompatibility and immunogenetics)Dragon dictation software. Dictation errors may occur despite best attempts at proofreading.   Final Clinical Impressions(s) / ED Diagnoses   Final diagnoses:  Follow up    ED Discharge  Orders    None       Dietrich Pates, PA-C 01/26/18 1610    Gerhard Munch, MD 01/29/18 2032

## 2018-01-26 NOTE — Discharge Instructions (Signed)
Continue the medications given by the provider during your past visit regardless of symptom improvement.

## 2018-01-26 NOTE — ED Notes (Signed)
Bed: WTR6 Expected date:  Expected time:  Means of arrival:  Comments: 

## 2018-01-26 NOTE — ED Triage Notes (Signed)
Pt here for follow up for left eye swelling. Needs note/clearance before returning to work.

## 2018-05-13 ENCOUNTER — Encounter: Payer: Self-pay | Admitting: Family Medicine

## 2018-05-13 ENCOUNTER — Ambulatory Visit: Payer: 59 | Admitting: Family Medicine

## 2018-05-13 ENCOUNTER — Other Ambulatory Visit: Payer: Self-pay

## 2018-05-13 VITALS — BP 189/104 | HR 76 | Resp 18 | Ht 65.0 in | Wt 272.4 lb

## 2018-05-13 DIAGNOSIS — I10 Essential (primary) hypertension: Secondary | ICD-10-CM

## 2018-05-13 DIAGNOSIS — R42 Dizziness and giddiness: Secondary | ICD-10-CM | POA: Diagnosis not present

## 2018-05-13 MED ORDER — MECLIZINE HCL 25 MG PO TABS: 25.0000 mg | ORAL_TABLET | Freq: Three times a day (TID) | ORAL | 3 refills | Status: DC | PRN

## 2018-05-13 MED ORDER — LOSARTAN POTASSIUM-HCTZ 50-12.5 MG PO TABS: 1.0000 | ORAL_TABLET | Freq: Every day | ORAL | 1 refills | Status: DC

## 2018-05-13 NOTE — Patient Instructions (Addendum)
  Take the losartan HCT 50/12.51 daily for blood pressure.  Best taken in the morning.  Get your blood pressure checked at work next week.  Schedule an appointment to follow-up with your blood pressure in about 1 month with a primary care doctor.  You can ask at the desk about this.  Use the meclizine 1/2 to 1 pill 3 times daily if needed for dizziness.  In the event of severe headache or acutely worsening dizziness go to the emergency room.  Come back and get rechecked sooner if the dizziness is continuing to persist.     IF you received an x-ray today, you will receive an invoice from [facility]. Please contact [facility] at [phone] with questions or concerns regarding your invoice.   IF you received labwork today, you will receive an invoice from [facility]. Please contact [facility] at [phone] with questions or concerns regarding your invoice.   Our billing staff will not be able to assist you with questions regarding bills from these companies.  You will be contacted with the lab results as soon as they are available. The fastest way to get your results is to activate your My Chart account. Instructions are located on the last page of this paperwork. If you have not heard from us regarding the results in 2 weeks, please contact this office.

## 2018-05-13 NOTE — Progress Notes (Signed)
Patient ID: Penny King, female    DOB: 1951-02-21  Age: 67 y.o. MRN: [id number]  Chief Complaint  Patient presents with  . Dizziness    since monday has been feeling off balance     Subjective:   67 year old lady who has been having dizziness since Monday.  She had been in her recliner and while she was reclined she started feeling dizzy.  It has been bothering her intermittently all week.  When she stands up she has to stabilize herself before moving forward.  She has continued to work all week despite this.  She does not have any headache.  No nausea or vomiting.  No other neurologic symptoms.  She does have a little bit of swelling in her right foot.  She has a history of having been on blood pressure medicine in the past but has not been on of late.  She also had a episode of dizziness problems in the past for which she had some meclizine but she no longer has any of that.  No chest pains or palpitations.  Current allergies, medications, problem list, past/family and social histories reviewed.  Objective:  BP (!) 189/104   Pulse 76   Resp 18   Ht 5\' 5"  (1.651 m)   Wt 272 lb 6.4 oz (123.6 kg)   SpO2 100%   BMI 45.33 kg/m   Pleasant lady, alert and oriented.  Overweight.  She has a history of external otitis problems, but her ears appear entirely normal today.  Her eyes are PERRLA.  EOMs intact.  Fundi benign.  Neck supple without nodes.  No carotid bruits.  Chest is clear to auscultation.  Heart regular without murmurs, gallops, or arrhythmias.  Neurologic is grossly intact with cranial nerves II 12 grossly intact.  The tandem walk is satisfactory.  Romberg negative.  Finger-to-nose normal.  No paresthesias.  Mild edema of the right foot.  Blood pressure is as noted.  Assessment & Plan:   Assessment: 1. Essential hypertension   2. Dizziness       Plan: Will treat the blood pressure and give her some meclizine.  If she is doing worse she is to come back sooner, otherwise she  needs to arrange a regular physician and get seen in about a month to follow-up on the blood pressure.  If possible she should get it checked somewhere in the meanwhile.  No orders of the defined types were placed in this encounter.   Meds ordered this encounter  Medications  . losartan-hydrochlorothiazide (HYZAAR) 50-12.5 MG tablet    Sig: Take 1 tablet by mouth daily.    Dispense:  90 tablet    Refill:  1  . meclizine (ANTIVERT) 25 MG tablet    Sig: Take 1 tablet (25 mg total) by mouth 3 (three) times daily as needed for dizziness.    Dispense:  30 tablet    Refill:  3         Patient Instructions    Take the losartan HCT 50/12.51 daily for blood pressure.  Best taken in the morning.  Get your blood pressure checked at work next week.  Schedule an appointment to follow-up with your blood pressure in about 1 month with a primary care doctor.  You can ask at the desk about this.  Use the meclizine 1/2 to 1 pill 3 times daily if needed for dizziness.  In the event of severe headache or acutely worsening dizziness go to the emergency room.  Come  back and get rechecked sooner if the dizziness is continuing to persist.     IF you received an x-ray today, you will receive an invoice from [facility]. Please contact [facility] at [phone] with questions or concerns regarding your invoice.   IF you received labwork today, you will receive an invoice from [facility]. Please contact [facility] at [phone] with questions or concerns regarding your invoice.   Our billing staff will not be able to assist you with questions regarding bills from these companies.  You will be contacted with the lab results as soon as they are available. The fastest way to get your results is to activate your My Chart account. Instructions are located on the last page of this paperwork. If you have not heard from us regarding the results in 2 weeks, please contact this office.         Return in about 1 month (around 06/12/2018).   Janace Hoardavid Hopper, MD 05/13/2018

## 2018-05-31 NOTE — Progress Notes (Signed)
Chief Complaint  Patient presents with  . essential hypertension    1 month f/u  . Dizziness    1 month f/u    HPI  Hypertension  Pt here for follow up for hypertension and dizziness Saw Dr. Alwyn RenHopper 05/13/18 She takes losartan-hctz 50-12.5mg   She adheres to a DASH diet and uses Ms. Dash seasoning She is exercising twice a week and uses the walking trail near house BP Readings from Last 3 Encounters:  06/07/18 138/88  05/13/18 (!) 189/104  01/26/18 (!) 156/92   Dizziness She reports that her dizziness resolved with increase blood pressure control She did not have to take the meclizine She states that she has been drinking more water and less soda and tea which seems to help  Morbid obesity She is walking twice a week  She is limiting her fried foods and eating healthier She also eats more fruits and vegetables She is weaning off tea and sodas Wt Readings from Last 3 Encounters:  06/07/18 269 lb 6.4 oz (122.2 kg)  05/13/18 272 lb 6.4 oz (123.6 kg)  01/22/18 250 lb (113.4 kg)     Past Medical History:  Diagnosis Date  . Allergy   . Heart murmur   . Hypertension     Current Outpatient Medications  Medication Sig Dispense Refill  . acetaminophen (TYLENOL) 500 MG tablet Take 1,000 mg by mouth every 6 (six) hours as needed for mild pain.    [name]. losartan-hydrochlorothiazide (HYZAAR) 50-12.5 MG tablet Take 1 tablet by mouth daily. 90 tablet 1  . meclizine (ANTIVERT) 25 MG tablet Take 1 tablet (25 mg total) by mouth 3 (three) times daily as needed for dizziness. 30 tablet 3  . Multiple Vitamin (MULTIVITAMIN WITH MINERALS) TABS tablet Take 1 tablet by mouth daily.     No current facility-administered medications for this visit.     Allergies: No Known Allergies  Past Surgical History:  Procedure Laterality Date  . ABDOMINAL HYSTERECTOMY      Social History   Socioeconomic History  . Marital status: Married    Spouse name: Not on file  . Number of children: Not on  file  . Years of education: Not on file  . Highest education level: Not on file  Occupational History  . Not on file  Social Needs  . Financial resource strain: Not on file  . Food insecurity:    Worry: Not on file    Inability: Not on file  . Transportation needs:    Medical: Not on file    Non-medical: Not on file  Tobacco Use  . Smoking status: Former Games developermoker  . Smokeless tobacco: Never Used  Substance and Sexual Activity  . Alcohol use: No    Alcohol/week: 0.0 oz  . Drug use: No  . Sexual activity: Not on file  Lifestyle  . Physical activity:    Days per week: Not on file    Minutes per session: Not on file  . Stress: Not on file  Relationships  . Social connections:    Talks on phone: Not on file    Gets together: Not on file    Attends religious service: Not on file    Active member of club or organization: Not on file    Attends meetings of clubs or organizations: Not on file    Relationship status: Not on file  Other Topics Concern  . Not on file  Social History Narrative  . Not on file    Family  History  Problem Relation Age of Onset  . Hypertension Father   . Diabetes Paternal Aunt   . Heart attack Neg Hx   . Stroke Neg Hx   . Colon cancer Neg Hx      ROS Review of Systems See HPI Constitution: No fevers or chills No malaise No diaphoresis Skin: No rash or itching Eyes: no blurry vision, no double vision GU: no dysuria or hematuria Neuro: no dizziness or headaches all others reviewed and negative   Objective: Vitals:   06/07/18 0902  BP: 138/88  Pulse: 84  Resp: 16  Temp: 98.9 F (37.2 C)  TempSrc: Oral  SpO2: 98%  Weight: 269 lb 6.4 oz (122.2 kg)  Height: 5\' 5"  (1.651 m)    Physical Exam  Physical Exam  Constitutional: She is oriented to person, place, and time. She appears well-developed and well-nourished.  HENT:  Head: Normocephalic and atraumatic.  Eyes: Conjunctivae and EOM are normal.  Cardiovascular: Normal rate,  regular rhythm and normal heart sounds.   Pulmonary/Chest: Effort normal and breath sounds normal. No respiratory distress. She has no wheezes.  Abdominal: Normal appearance and bowel sounds are normal. There is no tenderness. There is no CVA tenderness.  Neurological: She is alert and oriented to person, place, and time.   Lab Results  Component Value Date   HGBA1C 6.2 10/21/2015    Assessment and Plan Marylynne was seen today for essential hypertension and dizziness.  Diagnoses and all orders for this visit:  Essential hypertension Patient's blood pressure is at goal of 139/89 or less. Condition is stable. Continue current medications and treatment plan. I recommend that you exercise for 30-45 minutes 5 days a week. I also recommend a balanced diet with fruits and vegetables every day, lean meats, and little fried foods. The DASH diet (you can find this online) is a good example of this.  -     Comprehensive metabolic panel; Future  Dizziness- resolved with BP control   Screening for diabetes mellitus Prediabetes- discussed how to prevent diabetes -     Comprehensive metabolic panel; Future -     Hemoglobin A1c; Future  Mixed hyperlipidemia- will check fasting lipid  Discussed ways to increase HLD and lower triglycerides  -     Lipid panel; Future -     Comprehensive metabolic panel; Future   3 months follow up for office visit One month return for blood testing for fasting labs  Levoy Geisen A Creta Levin

## 2018-06-07 ENCOUNTER — Ambulatory Visit: Payer: 59 | Admitting: Family Medicine

## 2018-06-07 ENCOUNTER — Encounter: Payer: Self-pay | Admitting: Family Medicine

## 2018-06-07 VITALS — BP 138/88 | HR 84 | Temp 98.9°F | Resp 16 | Ht 65.0 in | Wt 269.4 lb

## 2018-06-07 DIAGNOSIS — R7303 Prediabetes: Secondary | ICD-10-CM | POA: Diagnosis not present

## 2018-06-07 DIAGNOSIS — R42 Dizziness and giddiness: Secondary | ICD-10-CM

## 2018-06-07 DIAGNOSIS — E782 Mixed hyperlipidemia: Secondary | ICD-10-CM

## 2018-06-07 DIAGNOSIS — I10 Essential (primary) hypertension: Secondary | ICD-10-CM

## 2018-06-07 DIAGNOSIS — Z131 Encounter for screening for diabetes mellitus: Secondary | ICD-10-CM

## 2018-06-07 NOTE — Patient Instructions (Addendum)
We recommend that you schedule a mammogram for breast cancer screening. Typically, you do not need a referral to do this. Please contact a local imaging center to schedule your mammogram.  [facility] - [phone]) [phone]  *ask for the Radiology Department [facility]) - [phone] or [phone](336) [phone]  [facility] - [phone] [facility] - [phone] [facility] - [phone]) [phone]  *ask for the Radiology Department [facility] - [phone]) [phone]  *ask for the Radiology Department [facility] - [phone]  *ask for the Mammography Department [facility] - [phone]) [phone]    IF you received an x-ray today, you will receive an invoice from [facility]. Please contact [facility] at [phone] with questions or concerns regarding your invoice.   IF you received labwork today, you will receive an invoice from [facility]. Please contact [facility] at [phone] with questions or concerns regarding your invoice.   Our billing staff will not be able to assist you with questions regarding bills from these companies.  You will be contacted with the lab results as soon as they are available. The fastest way to get your results is to activate your My Chart account. Instructions are located on the last page of this paperwork. If you have not heard from us regarding the results in 2 weeks, please contact this office.     Hypertension Hypertension, commonly called high blood pressure, is when the force of blood pumping through the arteries is too strong. The arteries are the blood vessels that carry blood from the heart throughout the body. Hypertension forces the heart to work harder to pump blood and may cause arteries to become narrow or stiff. Having untreated or uncontrolled hypertension can cause heart attacks, strokes, kidney disease, and other problems. A blood pressure reading  consists of a higher number over a lower number. Ideally, your blood pressure should be below 120/80. The first ("top") number is called the systolic pressure. It is a measure of the pressure in your arteries as your heart beats. The second ("bottom") number is called the diastolic pressure. It is a measure of the pressure in your arteries as the heart relaxes. What are the causes? The cause of this condition is not known. What increases the risk? Some risk factors for high blood pressure are under your control. Others are not. Factors you can change  Smoking.  Having type 2 diabetes mellitus, high cholesterol, or both.  Not getting enough exercise or physical activity.  Being overweight.  Having too much fat, sugar, calories, or salt (sodium) in your diet.  Drinking too much alcohol. Factors that are difficult or impossible to change  Having chronic kidney disease.  Having a family history of high blood pressure.  Age. Risk increases with age.  Race. You may be at higher risk if you are African-American.  Gender. Men are at higher risk than women before age 65. After age 65, women are at higher risk than men.  Having obstructive sleep apnea.  Stress. What are the signs or symptoms? Extremely high blood pressure (hypertensive crisis) may cause:  Headache.  Anxiety.  Shortness of breath.  Nosebleed.  Nausea and vomiting.  Severe chest pain.  Jerky movements you cannot control (seizures).  How is this diagnosed? This condition is diagnosed by measuring your blood pressure while you are seated, with your arm resting on a surface. The cuff of the blood pressure monitor will be placed directly against  the skin of your upper arm at the level of your heart. It should be measured at least twice using the same arm. Certain conditions can cause a difference in blood pressure between your right and left arms. Certain factors can cause blood pressure readings to be lower or  higher than normal (elevated) for a short period of time:  When your blood pressure is higher when you are in a health care provider's office than when you are at home, this is called white coat hypertension. Most people with this condition do not need medicines.  When your blood pressure is higher at home than when you are in a health care provider's office, this is called masked hypertension. Most people with this condition may need medicines to control blood pressure.  If you have a high blood pressure reading during one visit or you have normal blood pressure with other risk factors:  You may be asked to return on a different day to have your blood pressure checked again.  You may be asked to monitor your blood pressure at home for 1 week or longer.  If you are diagnosed with hypertension, you may have other blood or imaging tests to help your health care provider understand your overall risk for other conditions. How is this treated? This condition is treated by making healthy lifestyle changes, such as eating healthy foods, exercising more, and reducing your alcohol intake. Your health care provider may prescribe medicine if lifestyle changes are not enough to get your blood pressure under control, and if:  Your systolic blood pressure is above 130.  Your diastolic blood pressure is above 80.  Your personal target blood pressure may vary depending on your medical conditions, your age, and other factors. Follow these instructions at home: Eating and drinking  Eat a diet that is high in fiber and potassium, and low in sodium, added sugar, and fat. An example eating plan is called the DASH (Dietary Approaches to Stop Hypertension) diet. To eat this way: ? Eat plenty of fresh fruits and vegetables. Try to fill half of your plate at each meal with fruits and vegetables. ? Eat whole grains, such as whole wheat pasta, brown rice, or whole grain bread. Fill about one quarter of your plate  with whole grains. ? Eat or drink low-fat dairy products, such as skim milk or low-fat yogurt. ? Avoid fatty cuts of meat, processed or cured meats, and poultry with skin. Fill about one quarter of your plate with lean proteins, such as fish, chicken without skin, beans, eggs, and tofu. ? Avoid premade and processed foods. These tend to be higher in sodium, added sugar, and fat.  Reduce your daily sodium intake. Most people with hypertension should eat less than 1,500 mg of sodium a day.  Limit alcohol intake to no more than 1 drink a day for nonpregnant women and 2 drinks a day for men. One drink equals 12 oz of beer, 5 oz of wine, or 1 oz of hard liquor. Lifestyle  Work with your health care provider to maintain a healthy body weight or to lose weight. Ask what an ideal weight is for you.  Get at least 30 minutes of exercise that causes your heart to beat faster (aerobic exercise) most days of the week. Activities may include walking, swimming, or biking.  Include exercise to strengthen your muscles (resistance exercise), such as pilates or lifting weights, as part of your weekly exercise routine. Try to do these types of exercises  for 30 minutes at least 3 days a week.  Do not use any products that contain nicotine or tobacco, such as cigarettes and e-cigarettes. If you need help quitting, ask your health care provider.  Monitor your blood pressure at home as told by your health care provider.  Keep all follow-up visits as told by your health care provider. This is important. Medicines  Take over-the-counter and prescription medicines only as told by your health care provider. Follow directions carefully. Blood pressure medicines must be taken as prescribed.  Do not skip doses of blood pressure medicine. Doing this puts you at risk for problems and can make the medicine less effective.  Ask your health care provider about side effects or reactions to medicines that you should watch  for. Contact a health care provider if:  You think you are having a reaction to a medicine you are taking.  You have headaches that keep coming back (recurring).  You feel dizzy.  You have swelling in your ankles.  You have trouble with your vision. Get help right away if:  You develop a severe headache or confusion.  You have unusual weakness or numbness.  You feel faint.  You have severe pain in your chest or abdomen.  You vomit repeatedly.  You have trouble breathing. Summary  Hypertension is when the force of blood pumping through your arteries is too strong. If this condition is not controlled, it may put you at risk for serious complications.  Your personal target blood pressure may vary depending on your medical conditions, your age, and other factors. For most people, a normal blood pressure is less than 120/80.  Hypertension is treated with lifestyle changes, medicines, or a combination of both. Lifestyle changes include weight loss, eating a healthy, low-sodium diet, exercising more, and limiting alcohol. This information is not intended to replace advice given to you by your health care provider. Make sure you discuss any questions you have with your health care provider. Document Released: 11/22/2005 Document Revised: 10/20/2016 Document Reviewed: 10/20/2016 Elsevier Interactive Patient Education  [employer].

## 2018-10-05 ENCOUNTER — Other Ambulatory Visit: Payer: Self-pay | Admitting: Family Medicine

## 2018-10-05 DIAGNOSIS — I10 Essential (primary) hypertension: Secondary | ICD-10-CM

## 2018-10-05 NOTE — Telephone Encounter (Signed)
Requested Prescriptions  Pending Prescriptions Disp Refills  . losartan-hydrochlorothiazide (HYZAAR) 50-12.5 MG tablet [Pharmacy Med Name: LOSARTAN/HCTZ 50/12.5MG  TABLETS] 30 tablet 0    Sig: TAKE 1 TABLET BY MOUTH DAILY     Cardiovascular: ARB + Diuretic Combos Failed - 10/05/2018  3:46 AM      Failed - K in normal range and within 180 days    Potassium  Date Value Ref Range Status  01/22/2018 4.2 3.5 - 5.1 mmol/L Final         Failed - Na in normal range and within 180 days    Sodium  Date Value Ref Range Status  01/22/2018 138 135 - 145 mmol/L Final         Failed - Cr in normal range and within 180 days    Creat  Date Value Ref Range Status  10/21/2015 0.71 0.50 - 0.99 mg/dL Final   Creatinine, Ser  Date Value Ref Range Status  01/22/2018 0.80 0.44 - 1.00 mg/dL Final         Failed - Ca in normal range and within 180 days    Calcium  Date Value Ref Range Status  10/21/2015 9.9 8.6 - 10.4 mg/dL Final         Passed - Patient is not pregnant      Passed - Last BP in normal range    BP Readings from Last 1 Encounters:  06/07/18 138/88         Passed - Valid encounter within last 6 months    Recent Outpatient Visits          4 months ago Essential hypertension   [facility] at [facility], Manus Rudd, MD   4 months ago Essential hypertension   [facility] at [facility], Sandria Bales, MD   2 years ago Essential hypertension   [facility], Levell July, MD   3 years ago Annual physical exam   [facility], Levell July, MD   3 years ago Obesity   Primary Care at [facility], Harrel Lemon, MD           30 day courtesy refill given 0 refills.   Needs appt.   Message left to call and schedule an appt.

## 2018-11-21 ENCOUNTER — Other Ambulatory Visit: Payer: Self-pay | Admitting: Family Medicine

## 2018-11-21 DIAGNOSIS — I10 Essential (primary) hypertension: Secondary | ICD-10-CM

## 2018-11-21 MED ORDER — LOSARTAN POTASSIUM-HCTZ 50-12.5 MG PO TABS: 1.0000 | ORAL_TABLET | Freq: Every day | ORAL | 0 refills | Status: DC

## 2018-11-21 NOTE — Telephone Encounter (Signed)
Copied from CRM [id number]. Topic: Quick Communication - Rx Refill/Question >> Nov 21, 2018  7:46 AM Penny King, Penny King wrote: Medication: losartan-hydrochlorothiazide (HYZAAR) 50-12.5 MG tablet  Patient called to request a refill for the above medication  Preferred Pharmacy (with phone number or street name): [facility][id number] [name]- [place], [place] - [address] AT [facility] [address] [phone] (Phone) [phone] (Fax)

## 2018-11-21 NOTE — Telephone Encounter (Signed)
Labs due; appt 01/26/2019 with Dr. Creta LevinStallings

## 2018-12-06 HISTORY — PX: BREAST BIOPSY: SHX20

## 2019-01-26 ENCOUNTER — Encounter: Payer: 59 | Admitting: Family Medicine

## 2019-04-25 ENCOUNTER — Other Ambulatory Visit: Payer: Self-pay

## 2019-04-25 ENCOUNTER — Emergency Department (HOSPITAL_COMMUNITY)
Admission: EM | Admit: 2019-04-25 | Discharge: 2019-04-25 | Disposition: A | Discharge status: Home / Self Care | Payer: 59 | Attending: Emergency Medicine | Admitting: Emergency Medicine

## 2019-04-25 ENCOUNTER — Encounter (HOSPITAL_COMMUNITY): Payer: Self-pay

## 2019-04-25 ENCOUNTER — Emergency Department (HOSPITAL_COMMUNITY): Payer: 59

## 2019-04-25 DIAGNOSIS — R42 Dizziness and giddiness: Secondary | ICD-10-CM | POA: Diagnosis not present

## 2019-04-25 DIAGNOSIS — Z79899 Other long term (current) drug therapy: Secondary | ICD-10-CM | POA: Diagnosis not present

## 2019-04-25 DIAGNOSIS — I10 Essential (primary) hypertension: Secondary | ICD-10-CM | POA: Insufficient documentation

## 2019-04-25 DIAGNOSIS — H9202 Otalgia, left ear: Secondary | ICD-10-CM | POA: Diagnosis not present

## 2019-04-25 DIAGNOSIS — Z87891 Personal history of nicotine dependence: Secondary | ICD-10-CM | POA: Diagnosis not present

## 2019-04-25 HISTORY — DX: Dizziness and giddiness: R42

## 2019-04-25 LAB — URINALYSIS, ROUTINE W REFLEX MICROSCOPIC
Bilirubin Urine: NEGATIVE
Glucose, UA: NEGATIVE mg/dL
Hgb urine dipstick: NEGATIVE
Ketones, ur: NEGATIVE mg/dL
Leukocytes,Ua: NEGATIVE
Nitrite: NEGATIVE
Protein, ur: NEGATIVE mg/dL
Specific Gravity, Urine: 1.011 (ref 1.005–1.030)
pH: 7 (ref 5.0–8.0)

## 2019-04-25 LAB — CBC WITH DIFFERENTIAL/PLATELET
Abs Immature Granulocytes: 0.04 10*3/uL (ref 0.00–0.07)
Basophils Absolute: 0 10*3/uL (ref 0.0–0.1)
Basophils Relative: 0 %
Eosinophils Absolute: 0.1 10*3/uL (ref 0.0–0.5)
Eosinophils Relative: 1 %
HCT: 43.1 % (ref 36.0–46.0)
Hemoglobin: 13.6 g/dL (ref 12.0–15.0)
Immature Granulocytes: 0 %
Lymphocytes Relative: 23 %
Lymphs Abs: 2.2 10*3/uL (ref 0.7–4.0)
MCH: 29.8 pg (ref 26.0–34.0)
MCHC: 31.6 g/dL (ref 30.0–36.0)
MCV: 94.3 fL (ref 80.0–100.0)
Monocytes Absolute: 0.7 10*3/uL (ref 0.1–1.0)
Monocytes Relative: 7 %
Neutro Abs: 6.5 10*3/uL (ref 1.7–7.7)
Neutrophils Relative %: 69 %
Platelets: 282 10*3/uL (ref 150–400)
RBC: 4.57 MIL/uL (ref 3.87–5.11)
RDW: 13.4 % (ref 11.5–15.5)
WBC: 9.5 10*3/uL (ref 4.0–10.5)
nRBC: 0 % (ref 0.0–0.2)

## 2019-04-25 LAB — COMPREHENSIVE METABOLIC PANEL WITH GFR
ALT: 13 U/L (ref 0–44)
AST: 16 U/L (ref 15–41)
Albumin: 3.8 g/dL (ref 3.5–5.0)
Alkaline Phosphatase: 69 U/L (ref 38–126)
Anion gap: 8 (ref 5–15)
BUN: 17 mg/dL (ref 8–23)
CO2: 29 mmol/L (ref 22–32)
Calcium: 9.2 mg/dL (ref 8.9–10.3)
Chloride: 101 mmol/L (ref 98–111)
Creatinine, Ser: 0.68 mg/dL (ref 0.44–1.00)
GFR calc Af Amer: >60 mL/min
GFR calc non Af Amer: >60 mL/min
Glucose, Bld: 121 mg/dL — ABNORMAL HIGH (ref 70–99)
Potassium: 4.1 mmol/L (ref 3.5–5.1)
Sodium: 138 mmol/L (ref 135–145)
Total Bilirubin: 0.5 mg/dL (ref 0.3–1.2)
Total Protein: 7.8 g/dL (ref 6.5–8.1)

## 2019-04-25 MED ORDER — ONDANSETRON HCL 4 MG PO TABS: 4.0000 mg | ORAL_TABLET | Freq: Four times a day (QID) | ORAL | 0 refills | Status: DC

## 2019-04-25 MED ORDER — MECLIZINE HCL 25 MG PO TABS: 25.0000 mg | ORAL_TABLET | Freq: Three times a day (TID) | ORAL | 3 refills | Status: DC | PRN

## 2019-04-25 MED ORDER — LOSARTAN POTASSIUM-HCTZ 50-12.5 MG PO TABS: 1.0000 | ORAL_TABLET | Freq: Every day | ORAL | 0 refills | Status: DC

## 2019-04-25 MED ORDER — MECLIZINE HCL 25 MG PO TABS
25.0000 mg | ORAL_TABLET | Freq: Once | ORAL | Status: AC
  Administered 2019-04-25: 25 mg via ORAL
  Filled 2019-04-25: qty 1

## 2019-04-25 MED FILL — MECLIZINE 25 MG TABLET: 25 | 10 days supply | Qty: 30 | Fill #0

## 2019-04-25 MED FILL — HYDROCHLOROTHIAZIDE 12.5 MG: 12.5 | 30 days supply | Qty: 30 | Fill #0

## 2019-04-25 MED FILL — ONDANSETRON HCL 4 MG TABLET: 4 | 3 days supply | Qty: 12 | Fill #0

## 2019-04-25 MED FILL — LOSARTAN POTASSIUM 50 MG TA: 50 | 30 days supply | Qty: 30 | Fill #0

## 2019-04-25 NOTE — ED Provider Notes (Signed)
[facility]-EMERGENCY DEPT Provider Note   CSN: [id number] Arrival date & time: 04/25/19  4098  History   Chief Complaint Chief Complaint  Patient presents with   Dizziness   Otalgia   HPI Penny King is a 68 y.o. female with past medical history significant for hypertension, vertigo presents for evaluation of dizziness.  Patient has had intermittent dizziness since Monday, 2 days PTA.  Dizziness occurs when patient rolls over in bed, turns her head or bends over.  If patient is laying or not doing anything she does not any episodes of dizziness.  She has no associated sudden thunderclap headache, numbness, weakness, dysphasia, facial droop, neck pain or neck stiffness.  States this does feel similar to her previous episodes of vertigo.  States she is also had left ear pain x2 days.  Was recently treated for inner ear infection and is afraid it has returned.  Her pain is located to her tragus.  She has no pain to her mastoid or posterior ear.  She denies any tinnitus, ear drainage or discharge. Rates her ear pain a 1/10. Denies fever, chills, headache, nausea, vomiting, facial droop, unilateral weakness, difficulty swallowing, neck pain, neck stiffness, trauma, injury, chest pain, shortness of breath, diaphoresis, abdominal pain, diarrhea, dysuria, constipation.  She has been ambulatory without any difficulty.  She has also been without her blood pressure medicines x1 month.  Has follow-up with PCP in the middle of June, however they would not refill her medication without an appointment.  She is unsure what medication she takes.  Denies any additional aggrivating or alleviating factors.  History obtained from patient. No interpretor was used.  Chart review: BP meds-- Hyzaar 50-12.5 once daily     HPI  Past Medical History:  Diagnosis Date   Allergy    Heart murmur    Hypertension    Vertigo     Patient Active Problem List   Diagnosis Date Noted   Heart  murmur previously undiagnosed 07/31/2015   Vitamin D deficiency 07/18/2015   Hyperlipidemia 07/11/2015   Obesity 07/11/2015   Essential hypertension 07/11/2015   Prediabetes 07/11/2015    Past Surgical History:  Procedure Laterality Date   ABDOMINAL HYSTERECTOMY     TONSILLECTOMY       OB History   No obstetric history on file.      Home Medications    Prior to Admission medications   Medication Sig Start Date End Date Taking? Authorizing Provider  acetaminophen (TYLENOL) 500 MG tablet Take 1,000 mg by mouth every 6 (six) hours as needed for mild pain.   Yes [provider]  Multiple Vitamin (MULTIVITAMIN WITH MINERALS) TABS tablet Take 1 tablet by mouth daily.   Yes [provider]  losartan-hydrochlorothiazide (HYZAAR) 50-12.5 MG tablet Take 1 tablet by mouth daily. 04/25/19   Karrie Fluellen A, PA-C  meclizine (ANTIVERT) 25 MG tablet Take 1 tablet (25 mg total) by mouth 3 (three) times daily as needed for dizziness. 04/25/19   Brealyn Baril A, PA-C  ondansetron (ZOFRAN) 4 MG tablet Take 1 tablet (4 mg total) by mouth every 6 (six) hours. 04/25/19   Joshlynn Alfonzo A, PA-C    Family History Family History  Problem Relation Age of Onset   Hypertension Father    Diabetes Paternal Aunt    Heart attack Neg Hx    Stroke Neg Hx    Colon cancer Neg Hx     Social History Social History   Tobacco Use  Smoking status: Former Smoker   Smokeless tobacco: Never Used  Substance Use Topics   Alcohol use: No    Alcohol/week: 0.0 standard drinks   Drug use: No     Allergies   Patient has no known allergies.   Review of Systems Review of Systems  Constitutional: Negative.   HENT: Positive for ear pain. Negative for congestion, dental problem, drooling, ear discharge, facial swelling, hearing loss, nosebleeds, postnasal drip, rhinorrhea, sinus pressure, sinus pain, sneezing and sore throat.   Respiratory: Negative.   Cardiovascular:  Negative.   Gastrointestinal: Negative.   Genitourinary: Negative.   Musculoskeletal: Negative.   Skin: Negative.   Neurological: Positive for dizziness. Negative for seizures, syncope, facial asymmetry, speech difficulty, weakness, light-headedness, numbness and headaches.  All other systems reviewed and are negative.  Physical Exam Updated Vital Signs BP (!) 148/83    Pulse 62    Temp 98 F (36.7 C) (Oral)    Resp (!) 24    Ht 5\' 6"  (1.676 m)    SpO2 96%    BMI 43.48 kg/m   Physical Exam  Physical Exam  Constitutional: Pt is oriented to person, place, and time. Pt appears well-developed and well-nourished. No distress.  HENT:  Head: Normocephalic and atraumatic.  Mouth/Throat: Oropharynx is clear and moist.  Eyes: Conjunctivae and EOM are normal. Pupils are equal, round, and reactive to light. No scleral icterus.  No horizontal, vertical or rotational nystagmus  Ears: TMs normal.  No discharge.  No evidence of internal/external otitis.  No tenderness over mastoid or posterior ear.  Does have some mild tenderness with palpation to tragus to left ear. Neck: Normal range of motion. Neck supple.  Full active and passive ROM without pain No midline or paraspinal tenderness No nuchal rigidity or meningeal signs  Cardiovascular: Normal rate, regular rhythm and intact distal pulses.   Pulmonary/Chest: Effort normal and breath sounds normal. No respiratory distress. Pt has no wheezes. No rales.  Abdominal: Soft. Bowel sounds are normal. There is no tenderness. There is no rebound and no guarding.  Musculoskeletal: Normal range of motion.  Lymphadenopathy:    No cervical adenopathy.  Neurological: Pt. is alert and oriented to person, place, and time. He has normal reflexes. No cranial nerve deficit.  Exhibits normal muscle tone. Coordination normal.  Mental Status:  Alert, oriented, thought content appropriate. Speech fluent without evidence of aphasia. Able to follow 2 step commands  without difficulty.  Cranial Nerves:  II:  Peripheral visual fields grossly normal, pupils equal, round, reactive to light III,IV, VI: ptosis not present, extra-ocular motions intact bilaterally  V,VII: smile symmetric, facial light touch sensation equal VIII: hearing grossly normal bilaterally  IX,X: midline uvula rise  XI: bilateral shoulder shrug equal and strong XII: midline tongue extension  Motor:  5/5 in upper and lower extremities bilaterally including strong and equal grip strength and dorsiflexion/plantar flexion Sensory: Pinprick and light touch normal in all extremities.  Deep Tendon Reflexes: 2+ and symmetric  Cerebellar: normal finger-to-nose with bilateral upper extremities Gait: normal gait and balance CV: distal pulses palpable throughout   Skin: Skin is warm and dry. No rash noted. Pt is not diaphoretic.  Psychiatric: Pt has a normal mood and affect. Behavior is normal. Judgment and thought content normal.  Nursing note and vitals reviewed. ED Treatments / Results  Labs (all labs ordered are listed, but only abnormal results are displayed) Labs Reviewed  URINALYSIS, ROUTINE W REFLEX MICROSCOPIC - Abnormal; Notable for the following components:  Result Value   Color, Urine STRAW (*)    All other components within normal limits  COMPREHENSIVE METABOLIC PANEL - Abnormal; Notable for the following components:   Glucose, Bld 121 (*)    All other components within normal limits  CBC WITH DIFFERENTIAL/PLATELET    EKG EKG Interpretation  Date/Time:  Wednesday Apr 25 2019 08:58:34 EDT Ventricular Rate:  61 PR Interval:    QRS Duration: 96 QT Interval:  458 QTC Calculation: 462 R Axis:   18 Text Interpretation:  Sinus rhythm Confirmed by Kristine Royal [phone]) on 04/25/2019 9:44:30 AM   Radiology Ct Head Wo Contrast  Result Date: 04/25/2019 CLINICAL DATA:  Episodic vertigo. Left ear pain and dizziness for 2 days. EXAM: CT HEAD WITHOUT CONTRAST TECHNIQUE:  Contiguous axial images were obtained from the base of the skull through the vertex without intravenous contrast. COMPARISON:  MRI brain from 08/19/2014 and maxillofacial CT from 01/22/2018. FINDINGS: Brain: The brainstem, cerebellum, cerebral peduncles, thalami, basal ganglia, basilar cisterns, and ventricular system appear within normal limits. No intracranial hemorrhage, mass lesion, or acute CVA. Vascular: Unremarkable Skull: Symmetric appearance of the internal auditory canals. Calvarium intact. Sinuses/Orbits: Visualized paranasal sinuses appear clear. No mastoid effusion or appreciable middle ear fluid. Other: No supplemental non-categorized findings. IMPRESSION: 1. No specific cause for the patient's vertigo and dizziness identified. No findings of mastoid effusion or middle ear fluid. 2. If further imaging workup for labyrinthitis is warranted, MRI with and without contrast might be considered. Electronically Signed   By: Gaylyn Rong M.D.   On: 04/25/2019 10:39    Procedures Procedures (including critical care time)  Medications Ordered in ED Medications  meclizine (ANTIVERT) tablet 25 mg (25 mg Oral Given 04/25/19 0910)     Initial Impression / Assessment and Plan / ED Course  I have reviewed the triage vital signs and the nursing notes.  Pertinent labs & imaging results that were available during my care of the patient were reviewed by me and considered in my medical decision making (see chart for details).  68 year old female appears otherwise well presents for evaluation of dizziness.  Afebrile, nonseptic, non-ill-appearing.  Intermittent dizziness which is positional in nature began 2 days ago. Has history of vertigo and states this feels similar.  She also has left ear pain.  Was recently treated for an inner ear infection and is afraid this is returning.  She has no evidence of external or internal otitis.  She has no tenderness over her posterior ear over mastoid.  Hearing is  grossly intact.  She has no neck stiffness or neck rigidity.  She has a normal neurologic exam without neurologic deficits.  She has no nystagmus.  Her symptoms do sound like peripheral vertigo, however given ear pain without cause on exam I discussed patient my attending physician.  Recommend CT head noncontrast with reevaluation.  Given she does not have any headache, neck pain, neck stiffness, numbness to her face I have low suspicion for dissection at this time.  Also obtain baseline labs and EKG, however she does not have any chest pain, shortness of breath.  Lungs are clear.  She is ambulatory in department without ataxic gait.  She has a negative Romberg and pronator drift.  1100: Reevaluation patient without complaints.  She no longer has dizziness.  Orthostatic vital signs were negative.  She is able to walk without ataxic gait.  She has a continued nonfocal neurologic exam without neurologic deficits.  Labs and imaging unremarkable.  CT personally  reviewed.  No acute findings.  EKG without ST, T changes.  No STEMI.  Patient requesting DC home at this time.  Discussed possible MRI to rule out posterior ischemia.  Discussed risk versus benefit.  Patient has chosen not to proceed with MRI brain at this time.  I feel this is reasonable given her continued nonfocal neurologic exam.  Low suspicion for central cause of vertigo, cardiac or infectious pathology at this time. Discussed CT angio with attending to r/o dissection.  Given nonfocal neuro exam with resolvent of symptoms with meclizine this is less likely do not feel we need to do this at this time.  Patient with  negative skew test and normal neurologic exam.  No vertical or rotational nystagmus. Patient normal finger-nose and normal gait.  No slurred speech renal or weakness.  Doubt CVA or other central cause of vertigo.  History and physical consistent with peripheral vertigo symptoms.  We'll discharge home with meclizine.  Patient is  hemodynamically stable and in no acute distress.  Patient able to ambulate in department prior to ED.  Evaluation does not show acute pathology that would require ongoing or additional emergent interventions while in the emergency department or further inpatient treatment.  I have discussed the diagnosis with the patient and answered all questions.  Patient has no further complaints prior to discharge.  Patient is comfortable with plan discussed in room and is stable for discharge at this time.  I have discussed strict return precautions for returning to the emergency department.  Patient was encouraged to follow-up with PCP/specialist refer to at discharge.      Final Clinical Impressions(s) / ED Diagnoses   Final diagnoses:  Dizziness    ED Discharge Orders         Ordered    losartan-hydrochlorothiazide (HYZAAR) 50-12.5 MG tablet  Daily     04/25/19 1146    meclizine (ANTIVERT) 25 MG tablet  3 times daily PRN     04/25/19 1146    ondansetron (ZOFRAN) 4 MG tablet  Every 6 hours     04/25/19 1147           Yara Tomkinson A, PA-C 04/25/19 1159    Wynetta Fines, MD 04/25/19 1450

## 2019-04-25 NOTE — Discharge Instructions (Signed)
Your head CT and labs were negative.  This is likely vertigo.  We have given you medication in the ED.  Have also written a prescription for this. Follow up if you symptoms do not resolve.

## 2019-04-25 NOTE — ED Notes (Signed)
Patient transported to CT 

## 2019-04-25 NOTE — ED Triage Notes (Signed)
Patient c/o left ear pain and dizziness x 2 days. Patient reports a history of vertigo.

## 2019-04-25 NOTE — ED Notes (Signed)
Patient is getting dressed.

## 2019-04-25 NOTE — ED Notes (Signed)
Patient has ambulated to restroom without complication or assistance. 

## 2019-04-25 NOTE — ED Notes (Signed)
Lab stated labs were hemolyzed. RN has re-collected labs and sent them to Lab for analysis.

## 2019-05-18 ENCOUNTER — Telehealth: Payer: Self-pay | Admitting: Family Medicine

## 2019-05-18 NOTE — Telephone Encounter (Signed)
Pt was called in April, May, and June to reschedule CPE. VM was left for patient to CB.

## 2019-05-22 MED FILL — HYDROCHLOROTHIAZIDE 12.5 MG: 12.5 | 30 days supply | Qty: 30 | Fill #1

## 2019-05-22 MED FILL — LOSARTAN POTASSIUM 50 MG TA: 50 | 30 days supply | Qty: 30 | Fill #1

## 2019-06-07 ENCOUNTER — Encounter: Payer: 59 | Admitting: Family Medicine

## 2019-06-13 ENCOUNTER — Telehealth: Payer: Self-pay | Admitting: Family Medicine

## 2019-06-13 DIAGNOSIS — I10 Essential (primary) hypertension: Secondary | ICD-10-CM

## 2019-06-13 NOTE — Telephone Encounter (Signed)
Medication: losartan-hydrochlorothiazide (HYZAAR) 50-12.5 MG tablet [[id number]]   Pharmacy: [place], [place] - [place] [phone] (Phone) [phone] (Fax)

## 2019-06-15 MED ORDER — LOSARTAN POTASSIUM-HCTZ 50-12.5 MG PO TABS: 1.0000 | ORAL_TABLET | Freq: Every day | ORAL | 0 refills | Status: DC

## 2019-06-15 NOTE — Telephone Encounter (Signed)
I can only give her 30 tablets.  She needs an office visit to check her bp and to follow up with her from the ER visit in May

## 2019-06-18 MED FILL — HYDROCHLOROTHIAZIDE 12.5 MG: 12.5 | 30 days supply | Qty: 30 | Fill #0

## 2019-06-18 MED FILL — LOSARTAN POTASSIUM 50 MG TA: 50 | 30 days supply | Qty: 30 | Fill #0

## 2019-06-20 NOTE — Telephone Encounter (Signed)
lvm for patient to cb and schedule appnt for med refills

## 2019-08-27 ENCOUNTER — Encounter: Payer: Self-pay | Admitting: Family Medicine

## 2019-08-27 ENCOUNTER — Ambulatory Visit (INDEPENDENT_AMBULATORY_CARE_PROVIDER_SITE_OTHER): Payer: 59

## 2019-08-27 ENCOUNTER — Other Ambulatory Visit: Payer: Self-pay

## 2019-08-27 ENCOUNTER — Ambulatory Visit (INDEPENDENT_AMBULATORY_CARE_PROVIDER_SITE_OTHER): Payer: 59 | Admitting: Family Medicine

## 2019-08-27 VITALS — BP 177/101 | HR 81 | Temp 98.8°F | Resp 17 | Ht 66.0 in | Wt 278.0 lb

## 2019-08-27 DIAGNOSIS — Z0001 Encounter for general adult medical examination with abnormal findings: Secondary | ICD-10-CM

## 2019-08-27 DIAGNOSIS — Z13820 Encounter for screening for osteoporosis: Secondary | ICD-10-CM

## 2019-08-27 DIAGNOSIS — E2839 Other primary ovarian failure: Secondary | ICD-10-CM

## 2019-08-27 DIAGNOSIS — Z1159 Encounter for screening for other viral diseases: Secondary | ICD-10-CM

## 2019-08-27 DIAGNOSIS — Z23 Encounter for immunization: Secondary | ICD-10-CM | POA: Diagnosis not present

## 2019-08-27 DIAGNOSIS — M25561 Pain in right knee: Secondary | ICD-10-CM

## 2019-08-27 DIAGNOSIS — I10 Essential (primary) hypertension: Secondary | ICD-10-CM | POA: Diagnosis not present

## 2019-08-27 DIAGNOSIS — M1711 Unilateral primary osteoarthritis, right knee: Secondary | ICD-10-CM | POA: Diagnosis not present

## 2019-08-27 DIAGNOSIS — L83 Acanthosis nigricans: Secondary | ICD-10-CM | POA: Diagnosis not present

## 2019-08-27 DIAGNOSIS — M25461 Effusion, right knee: Secondary | ICD-10-CM | POA: Diagnosis not present

## 2019-08-27 DIAGNOSIS — R7301 Impaired fasting glucose: Secondary | ICD-10-CM

## 2019-08-27 DIAGNOSIS — Z0000 Encounter for general adult medical examination without abnormal findings: Secondary | ICD-10-CM

## 2019-08-27 DIAGNOSIS — Z1239 Encounter for other screening for malignant neoplasm of breast: Secondary | ICD-10-CM

## 2019-08-27 DIAGNOSIS — M7121 Synovial cyst of popliteal space [Baker], right knee: Secondary | ICD-10-CM

## 2019-08-27 DIAGNOSIS — Z299 Encounter for prophylactic measures, unspecified: Secondary | ICD-10-CM

## 2019-08-27 DIAGNOSIS — L918 Other hypertrophic disorders of the skin: Secondary | ICD-10-CM | POA: Diagnosis not present

## 2019-08-27 LAB — POCT URINALYSIS DIP (MANUAL ENTRY)
Bilirubin, UA: NEGATIVE
Glucose, UA: NEGATIVE mg/dL
Ketones, POC UA: NEGATIVE mg/dL
Leukocytes, UA: NEGATIVE
Nitrite, UA: NEGATIVE
Protein Ur, POC: NEGATIVE mg/dL
Spec Grav, UA: 1.025 (ref 1.010–1.025)
Urobilinogen, UA: 1 E.U./dL
pH, UA: 7 (ref 5.0–8.0)

## 2019-08-27 MED ORDER — ERGOCALCIFEROL 50 MCG (2000 UT) PO TABS: 1.0000 | ORAL_TABLET | Freq: Every day | ORAL | 11 refills | Status: DC

## 2019-08-27 MED ORDER — LOSARTAN POTASSIUM-HCTZ 50-12.5 MG PO TABS: 1.0000 | ORAL_TABLET | Freq: Every day | ORAL | 0 refills | Status: DC

## 2019-08-27 MED ORDER — LIDOCAINE-EPINEPHRINE (PF) 1 %-1:200000 IJ SOLN
4.0000 mL | Freq: Once | INTRAMUSCULAR | Status: AC
  Administered 2019-08-27: 4 mL via INTRADERMAL

## 2019-08-27 MED FILL — LOSARTAN-HCTZ 100-25 MG TAB: 100-25 | 30 days supply | Qty: 15 | Fill #0

## 2019-08-27 NOTE — Patient Instructions (Addendum)
We recommend that you schedule a mammogram for breast cancer screening. Typically, you do not need a referral to do this. Please contact a local imaging center to schedule your mammogram.  Ask to get bone density scheduled as well.   [facility]) - [phone] or [phone]    If you have lab work done today you will be contacted with your lab results within the next 2 weeks.  If you have not heard from [place] then please contact us. The fastest way to get your results is to register for My Chart.   IF you received an x-ray today, you will receive an invoice from [facility]. Please contact [facility] at [phone] with questions or concerns regarding your invoice.   IF you received labwork today, you will receive an invoice from [place]. Please contact [facility] at [phone] with questions or concerns regarding your invoice.   Our billing staff will not be able to assist you with questions regarding bills from these companies.  You will be contacted with the lab results as soon as they are available. The fastest way to get your results is to activate your My Chart account. Instructions are located on the last page of this paperwork. If you have not heard from [place] regarding the results in 2 weeks, please contact this office.     Baker Cyst A Baker cyst, also called a popliteal cyst, is a sac-like growth that forms at the back of the knee. The cyst forms when the fluid-filled sac (bursa) that cushions the knee joint becomes enlarged. The bursa that becomes a Baker cyst is located at the back of the knee joint. What are the causes? In most cases, a Baker cyst results from another knee problem that causes swelling inside the knee. This makes the fluid inside the knee joint (synovial fluid) flow into the bursa behind the knee, causing the bursa to enlarge. What increases the risk? You may be more likely to develop a Baker cyst if you  already have a knee problem, such as:  A tear in cartilage that cushions the knee joint (meniscal tear).  A tear in the tissues that connect the bones of the knee joint (ligament tear).  Knee swelling from osteoarthritis, rheumatoid arthritis, or gout. What are the signs or symptoms? A Baker cyst does not always cause symptoms. A lump behind the knee may be the only sign of the condition. The lump may be painful, especially when the knee is straightened. If the lump is painful, the pain may come and go. The knee may also be stiff. Symptoms may quickly get more severe if the cyst breaks open (ruptures). If your cyst ruptures, signs and symptoms may affect the knee and the back of the lower leg (calf) and may include:  Sudden or worsening pain.  Swelling.  Bruising. How is this diagnosed? This condition may be diagnosed based on your symptoms and medical history. Your health care provider will also do a physical exam. This may include:  Feeling the cyst to check whether it is tender.  Checking your knee for signs of another knee condition that causes swelling. You may have imaging tests, such as:  X-rays.  MRI.  Ultrasound. How is this treated? A Baker cyst that is not painful may go away without treatment. If the cyst gets large or painful, it will likely get better if the underlying knee problem is treated. Treatment for a Baker cyst may include:  Resting.  Keeping weight off of the knee. This means not leaning on the knee to support your body weight.  NSAIDs to reduce pain and swelling.  A procedure to drain the fluid from the cyst with a needle (aspiration). You may also get an injection of a medicine that reduces swelling (steroid).  Surgery. This may be needed if other treatments do not work. This usually involves correcting knee damage and removing the cyst. Follow these instructions at home:   Take over-the-counter and prescription medicines only as told by your  health care provider.  Rest and return to your normal activities as told by your health care provider. Avoid activities that make pain or swelling worse. Ask your health care provider what activities are safe for you.  Keep all follow-up visits as told by your health care provider. This is important. Contact a health care provider if:  You have knee pain, stiffness, or swelling that does not get better. Get help right away if:  You have sudden or worsening pain and swelling in your calf area. This information is not intended to replace advice given to you by your health care provider. Make sure you discuss any questions you have with your health care provider. Document Released: 11/22/2005 Document Revised: 11/04/2017 Document Reviewed: 08/12/2016 Elsevier Patient Education  [place]io Cyst A Baker cyst, also called a popliteal cyst, is a sac-like growth that forms at the back of the knee. The cyst forms when the fluid-filled sac (bursa) that cushions the knee joint becomes enlarged. The bursa that becomes a Baker cyst is located at the back of the knee joint. What are the causes? In most cases, a Baker cyst results from another knee problem that causes swelling inside the knee. This makes the fluid inside the knee joint (synovial fluid) flow into the bursa behind the knee, causing the bursa to enlarge. What increases the risk? You may be more likely to develop a Baker cyst if you already have a knee problem, such as:  A tear in cartilage that cushions the knee joint (meniscal tear).  A tear in the tissues that connect the bones of the knee joint (ligament tear).  Knee swelling from osteoarthritis, rheumatoid arthritis, or gout. What are the signs or symptoms? A Baker cyst does not always cause symptoms. A lump behind the knee may be the only sign of the condition. The lump may be painful, especially when the knee is straightened. If the lump is painful, the pain may come  and go. The knee may also be stiff. Symptoms may quickly get more severe if the cyst breaks open (ruptures). If your cyst ruptures, signs and symptoms may affect the knee and the back of the lower leg (calf) and may include:  Sudden or worsening pain.  Swelling.  Bruising. How is this diagnosed? This condition may be diagnosed based on your symptoms and medical history. Your health care provider will also do a physical exam. This may include:  Feeling the cyst to check whether it is tender.  Checking your knee for signs of another knee condition that causes swelling. You may have imaging tests, such as:  X-rays.  MRI.  Ultrasound. How is this treated? A Baker cyst that is not painful may go away without treatment. If the cyst gets large or painful, it will likely get better if the underlying knee problem is treated. Treatment for a Baker cyst may include:  Resting.  Keeping weight off of the knee.  This means not leaning on the knee to support your body weight.  NSAIDs to reduce pain and swelling.  A procedure to drain the fluid from the cyst with a needle (aspiration). You may also get an injection of a medicine that reduces swelling (steroid).  Surgery. This may be needed if other treatments do not work. This usually involves correcting knee damage and removing the cyst. Follow these instructions at home:   Take over-the-counter and prescription medicines only as told by your health care provider.  Rest and return to your normal activities as told by your health care provider. Avoid activities that make pain or swelling worse. Ask your health care provider what activities are safe for you.  Keep all follow-up visits as told by your health care provider. This is important. Contact a health care provider if:  You have knee pain, stiffness, or swelling that does not get better. Get help right away if:  You have sudden or worsening pain and swelling in your calf  area. This information is not intended to replace advice given to you by your health care provider. Make sure you discuss any questions you have with your health care provider. Document Released: 11/22/2005 Document Revised: 11/04/2017 Document Reviewed: 08/12/2016 Elsevier Patient Education  2020 [employer].

## 2019-08-27 NOTE — Progress Notes (Signed)
Chief Complaint  Patient presents with  . Annual Exam    cpe w/o pap.  pt has concerns about fat growth behind right leg and would like left checked also    Subjective:  Penny King is a 68 y.o. female here for a health maintenance visit.  Patient is established pt  She had 3 additional concerns today:  She ran out of her bp medications and has been having blood pressure changes and can feel it in her body She states that she had some fatigue with it She went to the ER for her blood pressure and was given some medications but ran out of that as well She takes losartan-hctz 50-12.'5mg'$  BP Readings from Last 3 Encounters:  08/27/19 (!) 177/101  04/25/19 (!) 148/83  06/07/18 138/88   Skin Tags She reports that she has 2 large skin tags The one of the right side is just hanging and is just irritating She said it has been there for a while   Right knee pain She has not been able to exercise due to knee issues on the right She has been having locking, buckling, and a mass behind her knee She takes tylenol for pain   Patient Active Problem List   Diagnosis Date Noted  . Heart murmur previously undiagnosed 07/31/2015  . Vitamin D deficiency 07/18/2015  . Hyperlipidemia 07/11/2015  . Obesity 07/11/2015  . Essential hypertension 07/11/2015  . Prediabetes 07/11/2015    Past Medical History:  Diagnosis Date  . Allergy   . Heart murmur   . Hypertension   . Vertigo     Past Surgical History:  Procedure Laterality Date  . ABDOMINAL HYSTERECTOMY    . TONSILLECTOMY       Outpatient Medications Prior to Visit  Medication Sig Dispense Refill  . acetaminophen (TYLENOL) 500 MG tablet Take 1,000 mg by mouth every 6 (six) hours as needed for mild pain.    [name] losartan-hydrochlorothiazide (HYZAAR) 50-12.5 MG tablet Take 1 tablet by mouth daily. 30 tablet 0  . meclizine (ANTIVERT) 25 MG tablet Take 1 tablet (25 mg total) by mouth 3 (three) times daily as needed for dizziness. (Patient  not taking: Reported on 08/27/2019) 30 tablet 3  . Multiple Vitamin (MULTIVITAMIN WITH MINERALS) TABS tablet Take 1 tablet by mouth daily.    . ondansetron (ZOFRAN) 4 MG tablet Take 1 tablet (4 mg total) by mouth every 6 (six) hours. (Patient not taking: Reported on 08/27/2019) 12 tablet 0   No facility-administered medications prior to visit.     No Known Allergies   Family History  Problem Relation Age of Onset  . Hypertension Father   . Diabetes Paternal Aunt   . Heart attack Neg Hx   . Stroke Neg Hx   . Colon cancer Neg Hx      Health Habits: Dental Exam: up to date Eye Exam: up to date Exercise: 0 times/week on average Current exercise activities: walking/running Diet:   Social History   Socioeconomic History  . Marital status: Married    Spouse name: Not on file  . Number of children: Not on file  . Years of education: Not on file  . Highest education level: Not on file  Occupational History  . Not on file  Social Needs  . Financial resource strain: Not on file  . Food insecurity    Worry: Not on file    Inability: Not on file  . Transportation needs    Medical: Not on  file    Non-medical: Not on file  Tobacco Use  . Smoking status: Former Research scientist (life sciences)  . Smokeless tobacco: Never Used  Substance and Sexual Activity  . Alcohol use: No    Alcohol/week: 0.0 standard drinks  . Drug use: No  . Sexual activity: Not on file  Lifestyle  . Physical activity    Days per week: Not on file    Minutes per session: Not on file  . Stress: Not on file  Relationships  . Social Herbalist on phone: Not on file    Gets together: Not on file    Attends religious service: Not on file    Active member of club or organization: Not on file    Attends meetings of clubs or organizations: Not on file    Relationship status: Not on file  . Intimate partner violence    Fear of current or ex partner: Not on file    Emotionally abused: Not on file    Physically abused:  Not on file    Forced sexual activity: Not on file  Other Topics Concern  . Not on file  Social History Narrative  . Not on file   Social History   Substance and Sexual Activity  Alcohol Use No  . Alcohol/week: 0.0 standard drinks   Social History   Tobacco Use  Smoking Status Former Smoker  Smokeless Tobacco Never Used   Social History   Substance and Sexual Activity  Drug Use No    GYN: Sexual Health Menstrual status: regular menses LMP: No LMP recorded. Patient has had a hysterectomy. Last pap smear: see HM section History of abnormal pap smears:  Sexually active: ** with ** partner Current contraception:   Health Maintenance: See under health Maintenance activity for review of completion dates as well. Immunization History  Administered Date(s) Administered  . Pneumococcal Conjugate-13 08/27/2019      Depression Screen-PHQ2/9 Depression screen [facility] 2/9 08/27/2019 06/07/2018 05/13/2018 10/21/2015 07/11/2015  Decreased Interest 0 0 0 0 0  Down, Depressed, Hopeless 0 0 0 0 0  PHQ - 2 Score 0 0 0 0 0       Depression Severity and Treatment Recommendations:  0-4= None  5-9= Mild / Treatment: Support, educate to call if worse; return in one month  10-14= Moderate / Treatment: Support, watchful waiting; Antidepressant or Psycotherapy  15-19= Moderately severe / Treatment: Antidepressant OR Psychotherapy  >= 20 = Major depression, severe / Antidepressant AND Psychotherapy    Review of Systems   ROS  See HPI for ROS as well.    Objective:   Vitals:   08/27/19 0813  BP: (!) 177/101  Pulse: 81  Resp: 17  Temp: 98.8 F (37.1 C)  TempSrc: Oral  Weight: 278 lb (126.1 kg)  Height: '5\' 6"'$  (1.676 m)    Body mass index is 44.87 kg/m.  Physical Exam BP (!) 177/101 (BP Location: Right Arm, Patient Position: Sitting, Cuff Size: Large) Comment: pt completely out of medication  Pulse 81   Temp 98.8 F (37.1 C) (Oral)   Resp 17   Ht '5\' 6"'$  (1.676 m)   Wt  278 lb (126.1 kg)   BMI 44.87 kg/m   General Appearance:    Alert, cooperative, no distress, appears stated age  Head:    Normocephalic, without obvious abnormality, atraumatic  Eyes:    PERRL, conjunctiva/corneas clear, EOM's intact  Ears:    Normal TM's and external ear canals, both ears  Nose:   Nares normal, septum midline, mucosa normal, no drainage    or sinus tenderness  Throat:   Lips, mucosa, and tongue normal; teeth and gums normal  Neck:   Supple, symmetrical, trachea midline, no adenopathy;    thyroid:  no enlargement/tenderness/nodules; no carotid   bruit or JVD  Back:     Symmetric, no curvature, ROM normal, no CVA tenderness  Lungs:     Clear to auscultation bilaterally, respirations unlabored  Chest Wall:    No tenderness or deformity   Heart:    Regular rate and rhythm, S1 and S2 normal, no murmur, rub   or gallop  Breast Exam:    No tenderness, masses, or nipple abnormality  Abdomen:     Soft, non-tender, bowel sounds active all four quadrants,    no masses, no organomegaly  Extremities:   Extremities normal, atraumatic, no cyanosis or edema  Pulses:   2+ and symmetric all extremities  Skin:   Skin color, texture, turgor normal, no rashes or lesions  Lymph nodes:   Cervical, supraclavicular, and axillary nodes normal  Neurologic:   CNII-XII intact, normal strength, sensation and reflexes    throughout       CLINICAL DATA:  68 year old female with a history right knee cyst  EXAM: RIGHT KNEE - COMPLETE 4+ VIEW  COMPARISON:  None.  FINDINGS: No acute displaced fracture. Medial greater than lateral joint space narrowing with marginal osteophyte formation. Degenerative changes at the patellofemoral joint. No joint effusion.  IMPRESSION: Negative for acute bony abnormality.  Early tricompartmental osteoarthritis   Electronically Signed   By: Corrie Mckusick D.O.   On: 08/27/2019 09:39    Assessment/Plan:   Patient was seen for a health  maintenance exam.  Counseled the patient on health maintenance issues. Reviewed her health mainteance schedule and ordered appropriate tests (see orders.) Counseled on regular exercise and weight management. Recommend regular eye exams and dental cleaning.   The following issues were addressed today for health maintenance:   Doniesha was seen today for annual exam.  Diagnoses and all orders for this visit:  Health maintenance examination - Women's Health Maintenance Plan Advised monthly breast exam and annual mammogram Advised dental exam every six months Discussed stress management Discussed pap smear screening guidelines  Need for prophylactic measure -     Pneumococcal conjugate vaccine 13-valent IM  Need for prophylactic vaccination and inoculation against influenza  Screening for osteoporosis -     DG Bone Density  Screening for breast cancer -     MM Digital Screening  Essential hypertension - BP not at goal, refilled sent in -     POCT urinalysis dipstick -     CMP14+EGFR -     Lipid panel -     losartan-hydrochlorothiazide (HYZAAR) 50-12.5 MG tablet; Take 1 tablet by mouth daily.  Pain and swelling of knee, right - likely arthritis causing baker's cyst -     CBC -     DG Knee Complete 4 Views Right; Future  Acanthosis nigricans -     Hemoglobin A1c  Impaired fasting glucose - reviewed labs and patient had previous sugar elevation  -     Hemoglobin A1c  Skin tag- removed without any complications, benign appearing  Was not sent to pathology -     lidocaine-EPINEPHrine (XYLOCAINE-EPINEPHrine) 1 %-1:200000 (PF) injection 4 mL  Estrogen deficiency -     DG Bone Density  Other orders -     Ergocalciferol 50 MCG (  2000 UT) TABS; Take 1 tablet by mouth daily.    Procedure Note Discussed procedure to remove skin tags. Consent signed and sent for scanning. Questions solicited and answered.  Area prepped and draped. Using lidocaine with epi for anesthesia the area  was removed with a 10 blade scalpel and dressed with steri strips. EBL minimal. Aftercare instructions reviewed.    Return in about 4 weeks (around 09/24/2019) for one month blood pressure nurse visit, 3 months follow up with PCP.    Body mass index is 44.87 kg/m.:  Discussed the patient's BMI with patient. The BMI body mass index is 44.87 kg/m.     Future Appointments  Date Time Provider [place]  09/26/2019  8:00 AM PCP-NURSE PCP-PCP [facility]  11/27/2019  8:20 AM Forrest Moron, MD PCP-PCP [facility]    Patient Instructions      We recommend that you schedule a mammogram for breast cancer screening. Typically, you do not need a referral to do this. Please contact a local imaging center to schedule your mammogram.  Ask to get bone density scheduled as well.   [facility] ([place]) - [phone] or [phone]    If you have lab work done today you will be contacted with your lab results within the next 2 weeks.  If you have not heard from [place] then please contact us. The fastest way to get your results is to register for My Chart.   IF you received an x-ray today, you will receive an invoice from [facility]. Please contact [facility] at [phone] with questions or concerns regarding your invoice.   IF you received labwork today, you will receive an invoice from [facility]. Please contact [facility] at [phone] with questions or concerns regarding your invoice.   Our billing staff will not be able to assist you with questions regarding bills from these companies.  You will be contacted with the lab results as soon as they are available. The fastest way to get your results is to activate your My Chart account. Instructions are located on the last page of this paperwork. If you have not heard from [place] regarding the results in 2 weeks, please contact this office.     Baker Cyst A Baker cyst, also called a popliteal cyst, is a  sac-like growth that forms at the back of the knee. The cyst forms when the fluid-filled sac (bursa) that cushions the knee joint becomes enlarged. The bursa that becomes a Baker cyst is located at the back of the knee joint. What are the causes? In most cases, a Baker cyst results from another knee problem that causes swelling inside the knee. This makes the fluid inside the knee joint (synovial fluid) flow into the bursa behind the knee, causing the bursa to enlarge. What increases the risk? You may be more likely to develop a Baker cyst if you already have a knee problem, such as:  A tear in cartilage that cushions the knee joint (meniscal tear).  A tear in the tissues that connect the bones of the knee joint (ligament tear).  Knee swelling from osteoarthritis, rheumatoid arthritis, or gout. What are the signs or symptoms? A Baker cyst does not always cause symptoms. A lump behind the knee may be the only sign of the condition. The lump may be painful, especially when the knee is straightened. If the lump is painful, the pain may come and go. The knee may also be stiff. Symptoms may quickly get  more severe if the cyst breaks open (ruptures). If your cyst ruptures, signs and symptoms may affect the knee and the back of the lower leg (calf) and may include:  Sudden or worsening pain.  Swelling.  Bruising. How is this diagnosed? This condition may be diagnosed based on your symptoms and medical history. Your health care provider will also do a physical exam. This may include:  Feeling the cyst to check whether it is tender.  Checking your knee for signs of another knee condition that causes swelling. You may have imaging tests, such as:  X-rays.  MRI.  Ultrasound. How is this treated? A Baker cyst that is not painful may go away without treatment. If the cyst gets large or painful, it will likely get better if the underlying knee problem is treated. Treatment for a Baker cyst may  include:  Resting.  Keeping weight off of the knee. This means not leaning on the knee to support your body weight.  NSAIDs to reduce pain and swelling.  A procedure to drain the fluid from the cyst with a needle (aspiration). You may also get an injection of a medicine that reduces swelling (steroid).  Surgery. This may be needed if other treatments do not work. This usually involves correcting knee damage and removing the cyst. Follow these instructions at home:   Take over-the-counter and prescription medicines only as told by your health care provider.  Rest and return to your normal activities as told by your health care provider. Avoid activities that make pain or swelling worse. Ask your health care provider what activities are safe for you.  Keep all follow-up visits as told by your health care provider. This is important. Contact a health care provider if:  You have knee pain, stiffness, or swelling that does not get better. Get help right away if:  You have sudden or worsening pain and swelling in your calf area. This information is not intended to replace advice given to you by your health care provider. Make sure you discuss any questions you have with your health care provider. Document Released: 11/22/2005 Document Revised: 11/04/2017 Document Reviewed: 08/12/2016 Elsevier Patient Education  [place] Cyst A Baker cyst, also called a popliteal cyst, is a sac-like growth that forms at the back of the knee. The cyst forms when the fluid-filled sac (bursa) that cushions the knee joint becomes enlarged. The bursa that becomes a Baker cyst is located at the back of the knee joint. What are the causes? In most cases, a Baker cyst results from another knee problem that causes swelling inside the knee. This makes the fluid inside the knee joint (synovial fluid) flow into the bursa behind the knee, causing the bursa to enlarge. What increases the risk? You may  be more likely to develop a Baker cyst if you already have a knee problem, such as:  A tear in cartilage that cushions the knee joint (meniscal tear).  A tear in the tissues that connect the bones of the knee joint (ligament tear).  Knee swelling from osteoarthritis, rheumatoid arthritis, or gout. What are the signs or symptoms? A Baker cyst does not always cause symptoms. A lump behind the knee may be the only sign of the condition. The lump may be painful, especially when the knee is straightened. If the lump is painful, the pain may come and go. The knee may also be stiff. Symptoms may quickly get more severe if the cyst breaks open (ruptures).  If your cyst ruptures, signs and symptoms may affect the knee and the back of the lower leg (calf) and may include:  Sudden or worsening pain.  Swelling.  Bruising. How is this diagnosed? This condition may be diagnosed based on your symptoms and medical history. Your health care provider will also do a physical exam. This may include:  Feeling the cyst to check whether it is tender.  Checking your knee for signs of another knee condition that causes swelling. You may have imaging tests, such as:  X-rays.  MRI.  Ultrasound. How is this treated? A Baker cyst that is not painful may go away without treatment. If the cyst gets large or painful, it will likely get better if the underlying knee problem is treated. Treatment for a Baker cyst may include:  Resting.  Keeping weight off of the knee. This means not leaning on the knee to support your body weight.  NSAIDs to reduce pain and swelling.  A procedure to drain the fluid from the cyst with a needle (aspiration). You may also get an injection of a medicine that reduces swelling (steroid).  Surgery. This may be needed if other treatments do not work. This usually involves correcting knee damage and removing the cyst. Follow these instructions at home:   Take over-the-counter and  prescription medicines only as told by your health care provider.  Rest and return to your normal activities as told by your health care provider. Avoid activities that make pain or swelling worse. Ask your health care provider what activities are safe for you.  Keep all follow-up visits as told by your health care provider. This is important. Contact a health care provider if:  You have knee pain, stiffness, or swelling that does not get better. Get help right away if:  You have sudden or worsening pain and swelling in your calf area. This information is not intended to replace advice given to you by your health care provider. Make sure you discuss any questions you have with your health care provider. Document Released: 11/22/2005 Document Revised: 11/04/2017 Document Reviewed: 08/12/2016 Elsevier Patient Education  2020 [employer].

## 2019-08-28 ENCOUNTER — Encounter: Payer: Self-pay | Admitting: Orthopaedic Surgery

## 2019-08-28 ENCOUNTER — Ambulatory Visit (INDEPENDENT_AMBULATORY_CARE_PROVIDER_SITE_OTHER): Payer: 59 | Admitting: Orthopaedic Surgery

## 2019-08-28 ENCOUNTER — Encounter: Payer: Self-pay | Admitting: Radiology

## 2019-08-28 DIAGNOSIS — Z1159 Encounter for screening for other viral diseases: Secondary | ICD-10-CM | POA: Diagnosis not present

## 2019-08-28 DIAGNOSIS — M1711 Unilateral primary osteoarthritis, right knee: Secondary | ICD-10-CM | POA: Diagnosis not present

## 2019-08-28 LAB — CMP14+EGFR
ALT: 8 IU/L (ref 0–32)
AST: 17 IU/L (ref 0–40)
Albumin/Globulin Ratio: 1.4 (ref 1.2–2.2)
Albumin: 4.4 g/dL (ref 3.8–4.8)
Alkaline Phosphatase: 83 IU/L (ref 39–117)
BUN/Creatinine Ratio: 19 (ref 12–28)
BUN: 15 mg/dL (ref 8–27)
Bilirubin Total: 0.4 mg/dL (ref 0.0–1.2)
CO2: 26 mmol/L (ref 20–29)
Calcium: 9.5 mg/dL (ref 8.7–10.3)
Chloride: 97 mmol/L (ref 96–106)
Creatinine, Ser: 0.81 mg/dL (ref 0.57–1.00)
GFR calc Af Amer: 88 mL/min/{1.73_m2} (ref >59)
GFR calc non Af Amer: 76 mL/min/{1.73_m2} (ref >59)
Globulin, Total: 3.2 g/dL (ref 1.5–4.5)
Glucose: 119 mg/dL — ABNORMAL HIGH (ref 65–99)
Potassium: 4.2 mmol/L (ref 3.5–5.2)
Sodium: 141 mmol/L (ref 134–144)
Total Protein: 7.6 g/dL (ref 6.0–8.5)

## 2019-08-28 LAB — LIPID PANEL
Chol/HDL Ratio: 3 ratio (ref 0.0–4.4)
Cholesterol, Total: 236 mg/dL — ABNORMAL HIGH (ref 100–199)
HDL: 80 mg/dL (ref >39)
LDL Chol Calc (NIH): 141 mg/dL — ABNORMAL HIGH (ref 0–99)
Triglycerides: 88 mg/dL (ref 0–149)
VLDL Cholesterol Cal: 15 mg/dL (ref 5–40)

## 2019-08-28 LAB — HEMOGLOBIN A1C
Est. average glucose Bld gHb Est-mCnc: 134 mg/dL
Hgb A1c MFr Bld: 6.3 % — ABNORMAL HIGH (ref 4.8–5.6)

## 2019-08-28 LAB — CBC
Hematocrit: 39 % (ref 34.0–46.6)
Hemoglobin: 12.8 g/dL (ref 11.1–15.9)
MCH: 29.3 pg (ref 26.6–33.0)
MCHC: 32.8 g/dL (ref 31.5–35.7)
MCV: 89 fL (ref 79–97)
Platelets: 257 10*3/uL (ref 150–450)
RBC: 4.37 x10E6/uL (ref 3.77–5.28)
RDW: 12.2 % (ref 11.7–15.4)
WBC: 8.1 10*3/uL (ref 3.4–10.8)

## 2019-08-28 MED ORDER — BUPIVACAINE HCL 0.25 % IJ SOLN
2.0000 mL | INTRAMUSCULAR | Status: AC | PRN
  Administered 2019-08-28: 16:00:00 2 mL via INTRA_ARTICULAR

## 2019-08-28 MED ORDER — LIDOCAINE HCL 1 % IJ SOLN
2.0000 mL | INTRAMUSCULAR | Status: AC | PRN
  Administered 2019-08-28: 2 mL

## 2019-08-28 MED ORDER — METHYLPREDNISOLONE ACETATE 40 MG/ML IJ SUSP
40.0000 mg | INTRAMUSCULAR | Status: AC | PRN
  Administered 2019-08-28: 40 mg via INTRA_ARTICULAR

## 2019-08-28 NOTE — Addendum Note (Signed)
Addended by: Delia Chimes A on: 08/28/2019 08:27 AM   Modules accepted: Orders

## 2019-08-28 NOTE — Progress Notes (Signed)
Office Visit Note   Patient: Penny King           Date of Birth: 03/06/1951           MRN: [id number] Visit Date: 08/28/2019              Requested by: Forrest Moron, MD [place],   [id number] PCP: Forrest Moron, MD   Assessment & Plan: Visit Diagnoses:  1. Unilateral primary osteoarthritis, right knee     Plan: Impression is right knee osteoarthritis.  We will inject this with cortisone today.  She will follow-up with [place] as needed.  Call with concerns or questions.  Follow-Up Instructions: Return if symptoms worsen or fail to improve.   Orders:  Orders Placed This Encounter  Procedures  . Large Joint Inj: R knee   No orders of the defined types were placed in this encounter.     Procedures: Large Joint Inj: R knee on 08/28/2019 4:00 PM Indications: pain Details: 22 G needle, anterolateral approach Medications: 2 mL bupivacaine 0.25 %; 2 mL lidocaine 1 %; 40 mg methylPREDNISolone acetate 40 MG/ML      Clinical Data: No additional findings.   Subjective: Chief Complaint  Patient presents with  . Right Knee - Pain    HPI patient is a pleasant 68 year old female who presents our clinic today with right knee pain.  Pain she has is to the entire aspect of the knee worse posteriorly.  She has been dealing with this for the past several years off and on.  Pain is described as a toothache worse going up and down stairs as well as when she is standing on her feet for long periods of time.  She gets mild relief with rest and elevation.  She has been taking Tylenol without significant relief of symptoms.  No mechanical symptoms noted.  No previous cortisone injection or surgical intervention.  Review of Systems as detailed in HPI.  All others reviewed and are negative.   Objective: Vital Signs: There were no vitals taken for this visit.  Physical Exam well-developed and well-nourished female in no acute distress.  Alert and oriented x3.  Ortho  Exam examination of the right knee shows no effusion.  Range of motion 0 to 100 degrees.  Medial joint line tenderness.  Marked L femoral crepitus.  She does have slight tenderness to the popliteal fossa.  Calf is soft nontender.  Ligaments are stable.  She is neurovascular intact distally.  Specialty Comments:  No specialty comments available.  Imaging: X-rays reviewed by me in canopy reveal mild to moderate joint space narrowing worse in the patella femoral and medial compartments with periarticular spurring.   PMFS History: Patient Active Problem List   Diagnosis Date Noted  . Heart murmur previously undiagnosed 07/31/2015  . Vitamin D deficiency 07/18/2015  . Hyperlipidemia 07/11/2015  . Obesity 07/11/2015  . Essential hypertension 07/11/2015  . Prediabetes 07/11/2015   Past Medical History:  Diagnosis Date  . Allergy   . Heart murmur   . Hypertension   . Vertigo     Family History  Problem Relation Age of Onset  . Hypertension Father   . Diabetes Paternal Aunt   . Heart attack Neg Hx   . Stroke Neg Hx   . Colon cancer Neg Hx     Past Surgical History:  Procedure Laterality Date  . ABDOMINAL HYSTERECTOMY    . TONSILLECTOMY     Social History  Occupational History  . Not on file  Tobacco Use  . Smoking status: Former Games developer  . Smokeless tobacco: Never Used  Substance and Sexual Activity  . Alcohol use: No    Alcohol/week: 0.0 standard drinks  . Drug use: No  . Sexual activity: Not on file

## 2019-08-29 LAB — HCV AB W/RFLX TO VERIFICATION: HCV Ab: <0.1 s/co ratio (ref 0.0–0.9)

## 2019-08-29 LAB — HCV INTERPRETATION

## 2019-09-18 ENCOUNTER — Telehealth: Payer: Self-pay | Admitting: Family Medicine

## 2019-09-18 NOTE — Telephone Encounter (Signed)
Returned patients call .LVM for her to call us back to reschedule her 11/27/2019 appt   FR

## 2019-09-26 ENCOUNTER — Ambulatory Visit (INDEPENDENT_AMBULATORY_CARE_PROVIDER_SITE_OTHER): Payer: 59 | Admitting: Family Medicine

## 2019-09-26 ENCOUNTER — Other Ambulatory Visit: Payer: Self-pay

## 2019-09-26 DIAGNOSIS — I10 Essential (primary) hypertension: Secondary | ICD-10-CM

## 2019-09-26 MED ORDER — LOSARTAN POTASSIUM-HCTZ 50-12.5 MG PO TABS: 1.0000 | ORAL_TABLET | Freq: Every day | ORAL | 0 refills | Status: DC

## 2019-09-26 MED FILL — LOSARTAN-HCTZ 50-12.5 MG TA: 50-12.5 | 30 days supply | Qty: 30 | Fill #0

## 2019-09-26 NOTE — Progress Notes (Signed)
Pt came in the office today  under nursing schedule---recheck blood pressure. Pt denied headache, dizziness and requested refilled  Rx Losartan potassium HCTZ which pt taking .5 tab daily.  B/P--170/93 regular cuff (first attempt) B/P--130/81 large---(second recheck)

## 2019-10-11 ENCOUNTER — Other Ambulatory Visit: Payer: Self-pay | Admitting: Family Medicine

## 2019-10-11 DIAGNOSIS — R921 Mammographic calcification found on diagnostic imaging of breast: Secondary | ICD-10-CM

## 2019-10-15 ENCOUNTER — Other Ambulatory Visit: Payer: Self-pay | Admitting: Family Medicine

## 2019-10-15 ENCOUNTER — Ambulatory Visit
Admission: RE | Admit: 2019-10-15 | Discharge: 2019-10-15 | Disposition: A | Discharge status: Home / Self Care | Payer: 59 | Source: Ambulatory Visit | Attending: Family Medicine | Admitting: Family Medicine

## 2019-10-15 ENCOUNTER — Other Ambulatory Visit: Payer: Self-pay

## 2019-10-15 DIAGNOSIS — R921 Mammographic calcification found on diagnostic imaging of breast: Secondary | ICD-10-CM

## 2019-10-17 ENCOUNTER — Ambulatory Visit
Admission: RE | Admit: 2019-10-17 | Discharge: 2019-10-17 | Disposition: A | Discharge status: Home / Self Care | Payer: 59 | Source: Ambulatory Visit | Attending: Family Medicine | Admitting: Family Medicine

## 2019-10-17 ENCOUNTER — Other Ambulatory Visit: Payer: Self-pay

## 2019-10-17 DIAGNOSIS — R921 Mammographic calcification found on diagnostic imaging of breast: Secondary | ICD-10-CM | POA: Diagnosis not present

## 2019-10-17 DIAGNOSIS — N6489 Other specified disorders of breast: Secondary | ICD-10-CM | POA: Diagnosis not present

## 2019-10-23 ENCOUNTER — Other Ambulatory Visit: Payer: Self-pay | Admitting: Family Medicine

## 2019-10-23 DIAGNOSIS — I10 Essential (primary) hypertension: Secondary | ICD-10-CM

## 2019-10-23 MED ORDER — LOSARTAN POTASSIUM-HCTZ 50-12.5 MG PO TABS: 1.0000 | ORAL_TABLET | Freq: Every day | ORAL | 0 refills | Status: DC

## 2019-10-23 MED FILL — LOSARTAN-HCTZ 50-12.5 MG TA: 50-12.5 | 30 days supply | Qty: 30 | Fill #0

## 2019-10-23 NOTE — Telephone Encounter (Signed)
Pt already has refills for this supplement

## 2019-10-23 NOTE — Telephone Encounter (Signed)
Requested medication (s) are due for refill today: yes  Requested medication (s) are on the active medication list: yes  Last refill:  08/27/2019  Future visit scheduled:yes  Notes to clinic:  Please resend pharmacy doesn't have script   Requested Prescriptions  Pending Prescriptions Disp Refills   Ergocalciferol 50 MCG (2000 UT) TABS 30 tablet 11    Sig: Take 1 tablet by mouth daily.     Endocrinology:  Vitamins - Vitamin D Supplementation Failed - 10/23/2019 11:18 AM      Failed - 50,000 IU strengths are not delegated      Failed - Phosphate in normal range and within 360 days    No results found for: PHOS       Failed - Vitamin D in normal range and within 360 days    Vit D, 25-Hydroxy  Date Value Ref Range Status  07/11/2015 20 (L) 30 - 100 ng/mL Final    Comment:    Vitamin D Status           25-OH Vitamin D        Deficiency                <20 ng/mL        Insufficiency         20 - 29 ng/mL        Optimal             > or = 30 ng/mL   For 25-OH Vitamin D testing on patients on D2-supplementation and patients for whom quantitation of D2 and D3 fractions is required, the QuestAssureD 25-OH VIT D, (D2,D3), LC/MS/MS is recommended: order code 80998 (patients > 2 yrs).          Passed - Ca in normal range and within 360 days    Calcium  Date Value Ref Range Status  08/27/2019 9.5 8.7 - 10.3 mg/dL Final         Passed - Valid encounter within last 12 months    Recent Outpatient Visits          3 weeks ago Essential hypertension   Primary Care at [facility], Manus Rudd, MD   1 month ago Health maintenance examination   Primary Care at [facility], [place], MD   1 year ago Essential hypertension   Primary Care at [facility], Manus Rudd, MD   1 year ago Essential hypertension   Primary Care at [facility], Sandria Bales, MD   4 years ago Essential hypertension   [facility] at [facility], Levell July, MD      Future Appointments            In 4 weeks  Doristine Bosworth, MD Primary Care at [place], [facility]           Signed Prescriptions Disp Refills   losartan-hydrochlorothiazide (HYZAAR) 50-12.5 MG tablet 30 tablet 0    Sig: Take 1 tablet by mouth daily.     Cardiovascular: ARB + Diuretic Combos Passed - 10/23/2019 11:18 AM      Passed - K in normal range and within 180 days    Potassium  Date Value Ref Range Status  08/27/2019 4.2 3.5 - 5.2 mmol/L Final         Passed - Na in normal range and within 180 days    Sodium  Date Value Ref Range Status  08/27/2019 141 134 - 144 mmol/L Final  Passed - Cr in normal range and within 180 days    Creat  Date Value Ref Range Status  10/21/2015 0.71 0.50 - 0.99 mg/dL Final   Creatinine, Ser  Date Value Ref Range Status  08/27/2019 0.81 0.57 - 1.00 mg/dL Final         Passed - Ca in normal range and within 180 days    Calcium  Date Value Ref Range Status  08/27/2019 9.5 8.7 - 10.3 mg/dL Final         Passed - Patient is not pregnant      Passed - Last BP in normal range    BP Readings from Last 1 Encounters:  09/26/19 130/81         Passed - Valid encounter within last 6 months    Recent Outpatient Visits          3 weeks ago Essential hypertension   Primary Care at [facility], Arlie Solomons, MD   1 month ago Health maintenance examination   Primary Care at [facility], Arlie Solomons, MD   1 year ago Essential hypertension   Primary Care at [facility], Arlie Solomons, MD   1 year ago Essential hypertension   Primary Care at [facility], Fenton Malling, MD   4 years ago Essential hypertension   [facility] at [facility], Laurey Arrow, MD      Future Appointments            In 4 weeks Forrest Moron, MD Primary Care at [place], [facility]

## 2019-10-23 NOTE — Telephone Encounter (Signed)
Medication Refill - Medication: Ergocalciferol 50 MCG (2000 UT) TABS/ losartan-hydrochlorothiazide (HYZAAR) 50-12.5 MG tablet     Has the patient contacted their pharmacy? Yes.   (Agent: If no, request that the patient contact the pharmacy for the refill.) (Agent: If yes, when and what did the pharmacy advise?)  Preferred Pharmacy (with phone number or street name):  [place], [place] - [place] [phone] (Phone) [phone] (Fax)     Agent: Please be advised that RX refills may take up to 3 business days. We ask that you follow-up with your pharmacy.

## 2019-10-29 MED ORDER — ERGOCALCIFEROL 50 MCG (2000 UT) PO TABS: 1.0000 | ORAL_TABLET | Freq: Every day | ORAL | 11 refills | Status: DC

## 2019-11-12 ENCOUNTER — Other Ambulatory Visit: Payer: Self-pay

## 2019-11-12 ENCOUNTER — Ambulatory Visit
Admission: RE | Admit: 2019-11-12 | Discharge: 2019-11-12 | Disposition: A | Discharge status: Home / Self Care | Payer: 59 | Source: Ambulatory Visit | Attending: Family Medicine | Admitting: Family Medicine

## 2019-11-12 DIAGNOSIS — Z780 Asymptomatic menopausal state: Secondary | ICD-10-CM | POA: Diagnosis not present

## 2019-11-12 DIAGNOSIS — Z13820 Encounter for screening for osteoporosis: Secondary | ICD-10-CM | POA: Diagnosis not present

## 2019-11-21 ENCOUNTER — Telehealth: Payer: Self-pay | Admitting: Family Medicine

## 2019-11-21 ENCOUNTER — Telehealth (INDEPENDENT_AMBULATORY_CARE_PROVIDER_SITE_OTHER): Payer: 59 | Admitting: Family Medicine

## 2019-11-21 ENCOUNTER — Other Ambulatory Visit: Payer: Self-pay

## 2019-11-21 DIAGNOSIS — I10 Essential (primary) hypertension: Secondary | ICD-10-CM

## 2019-11-21 MED ORDER — LOSARTAN POTASSIUM-HCTZ 50-12.5 MG PO TABS: 1.0000 | ORAL_TABLET | Freq: Every day | ORAL | 1 refills | Status: DC

## 2019-11-21 NOTE — Telephone Encounter (Signed)
11/21/2019 - PATIENT HAD A TELEMED WITH DR. Nolon Rod ON WED. (11/21/2019). DR. Nolon Rod HAS REQUESTED SHE RETURN FOR HER FOLLOW-UP IN 3 MONTHS (MARCH 2021). I TRIED TO CALL AND SCHEDULE BUT HAD TO LEAVE HER A VOICE MAIL TO RETURN MY CALL. [place]

## 2019-11-21 NOTE — Progress Notes (Signed)
Telemedicine Encounter- SOAP NOTE Established Patient  This telephone encounter was conducted with the patient's (or proxy's) verbal consent via audio telecommunications: yes/no: Yes Patient was instructed to have this encounter in a suitably private space; and to only have persons present to whom they give permission to participate. In addition, patient identity was confirmed by use of name plus two identifiers (DOB and address).  I discussed the limitations, risks, security and privacy concerns of performing an evaluation and management service by telephone and the availability of in person appointments. I also discussed with the patient that there may be a patient responsible charge related to this service. The patient expressed understanding and agreed to proceed.  I spent a total of TIME; 0 MIN TO 60 MIN: 15 minutes talking with the patient or their proxy.  No chief complaint on file.   Subjective   Penny King is a 68 y.o. established patient. Telephone visit today for  HPI  Hypertension: Patient here for follow-up of elevated blood pressure. She is not exercising and is adherent to low salt diet.  Blood pressure is well controlled at home. She checked it a week ago and it was 130/88.  Cardiac symptoms none. Patient denies chest pain, claudication, exertional chest pressure/discomfort, lower extremity edema and orthopnea.  Cardiovascular risk factors: hypertension and obesity (BMI >= 30 kg/m2). Use of agents associated with hypertension: none. History of target organ damage: none.  BP Readings from Last 3 Encounters:  09/26/19 130/81  08/27/19 (!) 177/101  04/25/19 (!) 148/83    Fall at home Pt reports that 2 weeks ago  She states that she slipped on the bath mat and hurt her back She denies any injury She has picked up the bath mat   Patient Active Problem List   Diagnosis Date Noted  . Heart murmur previously undiagnosed 07/31/2015  . Vitamin D deficiency 07/18/2015    . Hyperlipidemia 07/11/2015  . Obesity 07/11/2015  . Essential hypertension 07/11/2015  . Prediabetes 07/11/2015    Past Medical History:  Diagnosis Date  . Allergy   . Heart murmur   . Hypertension   . Vertigo     Current Outpatient Medications  Medication Sig Dispense Refill  . acetaminophen (TYLENOL) 500 MG tablet Take 1,000 mg by mouth every 6 (six) hours as needed for mild pain.    . Ergocalciferol 50 MCG (2000 UT) TABS Take 1 tablet by mouth daily. 30 tablet 11  . losartan-hydrochlorothiazide (HYZAAR) 50-12.5 MG tablet Take 1 tablet by mouth daily. 30 tablet 0   No current facility-administered medications for this visit.    No Known Allergies  Social History   Socioeconomic History  . Marital status: Married    Spouse name: Not on file  . Number of children: Not on file  . Years of education: Not on file  . Highest education level: Not on file  Occupational History  . Not on file  Tobacco Use  . Smoking status: Former Games developer  . Smokeless tobacco: Never Used  Substance and Sexual Activity  . Alcohol use: No    Alcohol/week: 0.0 standard drinks  . Drug use: No  . Sexual activity: Not on file  Other Topics Concern  . Not on file  Social History Narrative  . Not on file   Social Determinants of Health   Financial Resource Strain:   . Difficulty of Paying Living Expenses: Not on file  Food Insecurity:   . Worried About Programme researcher, broadcasting/film/video  in the Last Year: Not on file  . Ran Out of Food in the Last Year: Not on file  Transportation Needs:   . Lack of Transportation (Medical): Not on file  . Lack of Transportation (Non-Medical): Not on file  Physical Activity:   . Days of Exercise per Week: Not on file  . Minutes of Exercise per Session: Not on file  Stress:   . Feeling of Stress : Not on file  Social Connections:   . Frequency of Communication with Friends and Family: Not on file  . Frequency of Social Gatherings with Friends and Family: Not on  file  . Attends Religious Services: Not on file  . Active Member of Clubs or Organizations: Not on file  . Attends Archivist Meetings: Not on file  . Marital Status: Not on file  Intimate Partner Violence:   . Fear of Current or Ex-Partner: Not on file  . Emotionally Abused: Not on file  . Physically Abused: Not on file  . Sexually Abused: Not on file    ROS Review of Systems  Constitutional: Negative for activity change, appetite change, chills and fever.  HENT: Negative for congestion, nosebleeds, trouble swallowing and voice change.   Respiratory: Negative for cough, shortness of breath and wheezing.   Gastrointestinal: Negative for diarrhea, nausea and vomiting.  Genitourinary: Negative for difficulty urinating, dysuria, flank pain and hematuria.  Musculoskeletal: Negative for back pain, joint swelling and neck pain.  Neurological: Negative for dizziness, speech difficulty, light-headedness and numbness.  See HPI. All other review of systems negative.   Objective   Vitals as reported by the patient: There were no vitals filed for this visit.  Diagnoses and all orders for this visit:  Essential hypertension  Patient's blood pressure is at goal of 139/89 or less. Condition is stable. Continue current medications and treatment plan. I recommend that you exercise for 30-45 minutes 5 days a week. I also recommend a balanced diet with fruits and vegetables every day, lean meats, and little fried foods. The DASH diet (you can find this online) is a good example of this.    I discussed the assessment and treatment plan with the patient. The patient was provided an opportunity to ask questions and all were answered. The patient agreed with the plan and demonstrated an understanding of the instructions.   The patient was advised to call back or seek an in-person evaluation if the symptoms worsen or if the condition fails to improve as anticipated.  I provided 15 minutes  of non-face-to-face time during this encounter.  Forrest Moron, MD  Primary Care at [facility]

## 2019-11-21 NOTE — Progress Notes (Signed)
CALLED PT AND [facility] TO SCHEDULE 3 MONTH F/U AROUND 3/15

## 2019-11-27 ENCOUNTER — Ambulatory Visit: Payer: 59 | Admitting: Family Medicine

## 2019-12-03 ENCOUNTER — Other Ambulatory Visit: Payer: Self-pay | Admitting: Family Medicine

## 2019-12-03 DIAGNOSIS — I10 Essential (primary) hypertension: Secondary | ICD-10-CM

## 2019-12-03 MED FILL — LOSARTAN-HCTZ 50-12.5 MG TA: 50-12.5 | 30 days supply | Qty: 30 | Fill #0

## 2019-12-11 DIAGNOSIS — Z20828 Contact with and (suspected) exposure to other viral communicable diseases: Secondary | ICD-10-CM | POA: Diagnosis not present

## 2019-12-27 DIAGNOSIS — Z20828 Contact with and (suspected) exposure to other viral communicable diseases: Secondary | ICD-10-CM | POA: Diagnosis not present

## 2020-01-03 ENCOUNTER — Other Ambulatory Visit: Payer: Self-pay | Admitting: Family Medicine

## 2020-01-03 DIAGNOSIS — I10 Essential (primary) hypertension: Secondary | ICD-10-CM

## 2020-01-03 MED FILL — LOSARTAN-HCTZ 50-12.5 MG TA: 50-12.5 | 30 days supply | Qty: 30 | Fill #0

## 2020-01-03 NOTE — Telephone Encounter (Signed)
Requested Prescriptions  Pending Prescriptions Disp Refills  . losartan-hydrochlorothiazide (HYZAAR) 50-12.5 MG tablet [Pharmacy Med Name: LOSARTAN-HCTZ 50-12.5 MG TA 50-12.5 Tablet] 30 tablet 0    Sig: TAKE 1 TABLET BY MOUTH ONCE DAILY     Cardiovascular: ARB + Diuretic Combos Passed - 01/03/2020  9:06 AM      Passed - K in normal range and within 180 days    Potassium  Date Value Ref Range Status  08/27/2019 4.2 3.5 - 5.2 mmol/L Final         Passed - Na in normal range and within 180 days    Sodium  Date Value Ref Range Status  08/27/2019 141 134 - 144 mmol/L Final         Passed - Cr in normal range and within 180 days    Creat  Date Value Ref Range Status  10/21/2015 0.71 0.50 - 0.99 mg/dL Final   Creatinine, Ser  Date Value Ref Range Status  08/27/2019 0.81 0.57 - 1.00 mg/dL Final         Passed - Ca in normal range and within 180 days    Calcium  Date Value Ref Range Status  08/27/2019 9.5 8.7 - 10.3 mg/dL Final   Calcium, Ion  Date Value Ref Range Status  01/22/2018 1.11 (L) 1.15 - 1.40 mmol/L Final         Passed - Patient is not pregnant      Passed - Last BP in normal range    BP Readings from Last 1 Encounters:  09/26/19 130/81         Passed - Valid encounter within last 6 months    Recent Outpatient Visits          1 month ago Essential hypertension   Primary Care at [facility], Manus Rudd, MD   3 months ago Essential hypertension   Primary Care at [facility], Manus Rudd, MD   4 months ago Health maintenance examination   Primary Care at [facility], Manus Rudd, MD   1 year ago Essential hypertension   [facility] at [facility], Manus Rudd, MD   1 year ago Essential hypertension   Primary Care at [facility], Sandria Bales, MD

## 2020-02-04 MED FILL — LOSARTAN-HCTZ 50-12.5 MG TA: 50-12.5 | 30 days supply | Qty: 30 | Fill #1

## 2020-03-05 ENCOUNTER — Other Ambulatory Visit: Payer: Self-pay | Admitting: Family Medicine

## 2020-03-05 DIAGNOSIS — I10 Essential (primary) hypertension: Secondary | ICD-10-CM

## 2020-03-05 MED FILL — LOSARTAN-HCTZ 50-12.5 MG TA: 50-12.5 | 90 days supply | Qty: 90 | Fill #0

## 2020-06-16 ENCOUNTER — Other Ambulatory Visit: Payer: Self-pay | Admitting: Family Medicine

## 2020-06-16 DIAGNOSIS — I10 Essential (primary) hypertension: Secondary | ICD-10-CM

## 2020-06-16 MED ORDER — LOSARTAN POTASSIUM-HCTZ 50-12.5 MG PO TABS: 1.0000 | ORAL_TABLET | Freq: Every day | ORAL | 0 refills | Status: DC

## 2020-06-16 MED FILL — LOSARTAN-HCTZ 50-12.5 MG TA: 50-12.5 | 30 days supply | Qty: 30 | Fill #0

## 2020-06-16 NOTE — Telephone Encounter (Signed)
losartan-hydrochlorothiazide (HYZAAR) 50-12.5 MG tablet Medication Date: 03/05/2020 Department: Primary Care at [facility] Ordering/Authorizing: Doristine Bosworth, MD   [facility] - [place], [place] - [address] [place] Phone:  [phone]  Fax:  [phone]

## 2020-06-26 ENCOUNTER — Encounter: Payer: Self-pay | Admitting: Family Medicine

## 2020-06-26 ENCOUNTER — Ambulatory Visit: Payer: 59 | Admitting: Family Medicine

## 2020-06-26 ENCOUNTER — Other Ambulatory Visit: Payer: Self-pay | Admitting: Family Medicine

## 2020-06-26 ENCOUNTER — Other Ambulatory Visit: Payer: Self-pay

## 2020-06-26 VITALS — BP 128/83 | HR 75 | Temp 97.7°F | Ht 66.0 in | Wt 268.0 lb

## 2020-06-26 DIAGNOSIS — R7303 Prediabetes: Secondary | ICD-10-CM

## 2020-06-26 DIAGNOSIS — I10 Essential (primary) hypertension: Secondary | ICD-10-CM | POA: Diagnosis not present

## 2020-06-26 DIAGNOSIS — Z23 Encounter for immunization: Secondary | ICD-10-CM

## 2020-06-26 DIAGNOSIS — E782 Mixed hyperlipidemia: Secondary | ICD-10-CM

## 2020-06-26 MED ORDER — LOSARTAN POTASSIUM-HCTZ 50-12.5 MG PO TABS: 1.0000 | ORAL_TABLET | Freq: Every day | ORAL | 3 refills | Status: DC

## 2020-06-26 NOTE — Patient Instructions (Addendum)
  shingrix vaccine is a 2 series vaccine. Second dose due in 2-6 months. you can come in as a nurse visit for shingles vaccines   If you have lab work done today you will be contacted with your lab results within the next 2 weeks.  If you have not heard from [place] then please contact us. The fastest way to get your results is to register for My Chart.   IF you received an x-ray today, you will receive an invoice from [facility]. Please contact [facility] at [phone] with questions or concerns regarding your invoice.   IF you received labwork today, you will receive an invoice from [facility]. Please contact [facility] at [phone] with questions or concerns regarding your invoice.   Our billing staff will not be able to assist you with questions regarding bills from these companies.  You will be contacted with the lab results as soon as they are available. The fastest way to get your results is to activate your My Chart account. Instructions are located on the last page of this paperwork. If you have not heard from [place] regarding the results in 2 weeks, please contact this office.

## 2020-06-26 NOTE — Progress Notes (Signed)
7/22/202110:38 AM  Penny King 05-Aug-1951, 69 y.o., female [id number]  Chief Complaint  Patient presents with  . Hypertension    readings at home 130/80's   . Transitions Of Care    HPI:   Patient is a 69 y.o. female with past medical history significant for HTN, HLP, prediabetes, vitamin D deficiency who presents today for TOC  Previous PCP Dr Nolon Rod last OV dec 2020 She is overall doing well She staying away of snacks and eating more veggies She walks with her daughter - trial near her house She is due for Tdap (has young grandchildren) She would like her shigles vaccine, reports h/o chichenpox  Lab Results  Component Value Date   HGBA1C 6.3 (H) 08/27/2019   HGBA1C 6.2 10/21/2015   HGBA1C 6.2 (A) 10/21/2015   Lab Results  Component Value Date   LDLCALC 141 (H) 08/27/2019   CREATININE 0.81 08/27/2019   Wt Readings from Last 3 Encounters:  06/26/20 268 lb (121.6 kg)  08/27/19 278 lb (126.1 kg)  06/07/18 269 lb 6.4 oz (122.2 kg)   BP Readings from Last 3 Encounters:  06/26/20 128/83  09/26/19 130/81  08/27/19 (!) 177/101    Depression screen PHQ 2/9 06/26/2020 11/21/2019 08/27/2019  Decreased Interest 0 0 0  Down, Depressed, Hopeless 0 0 0  PHQ - 2 Score 0 0 0    Fall Risk  06/26/2020 11/21/2019 08/27/2019 06/07/2018 05/13/2018  Falls in the past year? 0 1 0 No No  Number falls in past yr: 0 1 0 - -  Injury with Fall? 0 0 0 - -  Risk for fall due to : - History of fall(s);Other (Comment) - - -  Follow up Falls evaluation completed Falls evaluation completed;Education provided Falls evaluation completed - -     No Known Allergies  Prior to Admission medications   Medication Sig Start Date End Date Taking? Authorizing Provider  Ergocalciferol 50 MCG (2000 UT) TABS Take 1 tablet by mouth daily. 10/29/19  Yes Stallings, Zoe A, MD  losartan-hydrochlorothiazide (HYZAAR) 50-12.5 MG tablet Take 1 tablet by mouth daily. 06/16/20  Yes Rutherford Guys, MD    Past  Medical History:  Diagnosis Date  . Allergy   . Heart murmur   . Hypertension   . Vertigo     Past Surgical History:  Procedure Laterality Date  . ABDOMINAL HYSTERECTOMY    . TONSILLECTOMY      Social History   Tobacco Use  . Smoking status: Former Research scientist (life sciences)  . Smokeless tobacco: Never Used  Substance Use Topics  . Alcohol use: No    Alcohol/week: 0.0 standard drinks    Family History  Problem Relation Age of Onset  . Hypertension Father   . Diabetes Paternal Aunt   . Heart attack Neg Hx   . Stroke Neg Hx   . Colon cancer Neg Hx     Review of Systems  Constitutional: Negative for chills and fever.  Respiratory: Negative for cough and shortness of breath.   Cardiovascular: Negative for chest pain, palpitations and leg swelling.  Gastrointestinal: Negative for abdominal pain, nausea and vomiting.     OBJECTIVE:  Today's Vitals   06/26/20 1016 06/26/20 1024  BP: (!) 151/87 128/83  Pulse: 75   Temp: 97.7 F (36.5 C)   SpO2: 99%   Weight: 268 lb (121.6 kg)   Height: _0  (1.676 m)    Body mass index is 43.26 kg/m.   Physical Exam Vitals and nursing  note reviewed.  Constitutional:      Appearance: She is well-developed.  HENT:     Head: Normocephalic and atraumatic.     Mouth/Throat:     Pharynx: No oropharyngeal exudate.  Eyes:     General: No scleral icterus.    Conjunctiva/sclera: Conjunctivae normal.     Pupils: Pupils are equal, round, and reactive to light.  Cardiovascular:     Rate and Rhythm: Normal rate and regular rhythm.     Heart sounds: Normal heart sounds. No murmur heard.  No friction rub. No gallop.   Pulmonary:     Effort: Pulmonary effort is normal.     Breath sounds: Normal breath sounds. No wheezing, rhonchi or rales.  Musculoskeletal:     Cervical back: Neck supple.  Skin:    General: Skin is warm and dry.  Neurological:     Mental Status: She is alert and oriented to person, place, and time.     No results found for  this or any previous visit (from the past 24 hour(s)).  No results found.   ASSESSMENT and PLAN  1. Essential hypertension Controlled. Continue current regime.  - losartan-hydrochlorothiazide (HYZAAR) 50-12.5 MG tablet; Take 1 tablet by mouth daily. - CMP14+EGFR  2. Mixed hyperlipidemia Labs pending. Cont LFM - Lipid panel  3. Prediabetes Labs pending. Cont LFM - Hemoglobin A1c  Other orders - Tdap vaccine greater than or equal to 7yo IM  Return in about 6 months (around 12/27/2020).    Rutherford Guys, MD Primary Care at [facility], [place] [address] Ph.  [phone] Fax [phone]

## 2020-06-27 LAB — CMP14+EGFR
ALT: 8 IU/L (ref 0–32)
AST: 17 IU/L (ref 0–40)
Albumin/Globulin Ratio: 1.2 (ref 1.2–2.2)
Albumin: 4.3 g/dL (ref 3.8–4.8)
Alkaline Phosphatase: 82 IU/L (ref 48–121)
BUN/Creatinine Ratio: 18 (ref 12–28)
BUN: 16 mg/dL (ref 8–27)
Bilirubin Total: 0.5 mg/dL (ref 0.0–1.2)
CO2: 24 mmol/L (ref 20–29)
Calcium: 9.6 mg/dL (ref 8.7–10.3)
Chloride: 95 mmol/L — ABNORMAL LOW (ref 96–106)
Creatinine, Ser: 0.87 mg/dL (ref 0.57–1.00)
GFR calc Af Amer: 80 mL/min/{1.73_m2} (ref >59)
GFR calc non Af Amer: 70 mL/min/{1.73_m2} (ref >59)
Globulin, Total: 3.6 g/dL (ref 1.5–4.5)
Glucose: 101 mg/dL — ABNORMAL HIGH (ref 65–99)
Potassium: 4.2 mmol/L (ref 3.5–5.2)
Sodium: 137 mmol/L (ref 134–144)
Total Protein: 7.9 g/dL (ref 6.0–8.5)

## 2020-06-27 LAB — LIPID PANEL
Chol/HDL Ratio: 3.1 ratio (ref 0.0–4.4)
Cholesterol, Total: 248 mg/dL — ABNORMAL HIGH (ref 100–199)
HDL: 80 mg/dL (ref >39)
LDL Chol Calc (NIH): 154 mg/dL — ABNORMAL HIGH (ref 0–99)
Triglycerides: 84 mg/dL (ref 0–149)
VLDL Cholesterol Cal: 14 mg/dL (ref 5–40)

## 2020-06-27 LAB — HEMOGLOBIN A1C
Est. average glucose Bld gHb Est-mCnc: 137 mg/dL
Hgb A1c MFr Bld: 6.4 % — ABNORMAL HIGH (ref 4.8–5.6)

## 2020-07-07 ENCOUNTER — Encounter: Payer: Self-pay | Admitting: Radiology

## 2020-07-11 MED FILL — LOSARTAN-HCTZ 50-12.5 MG TA: 50-12.5 | 90 days supply | Qty: 90 | Fill #0

## 2020-09-13 IMAGING — MG MM BREAST BX W LOC DEV 1ST LESION IMAGE BX SPEC STEREO GUIDE*L*
8 of 13 series · 8 of 29 positions shown · non-contrast
Comparison: Previous exams.
COMPARISON: Previous exams.

Addendum:
CLINICAL DATA: Left breast calcifications.

EXAM:
RIGHT BREAST STEREOTACTIC CORE NEEDLE BIOPSY

[L (1 of 6)]
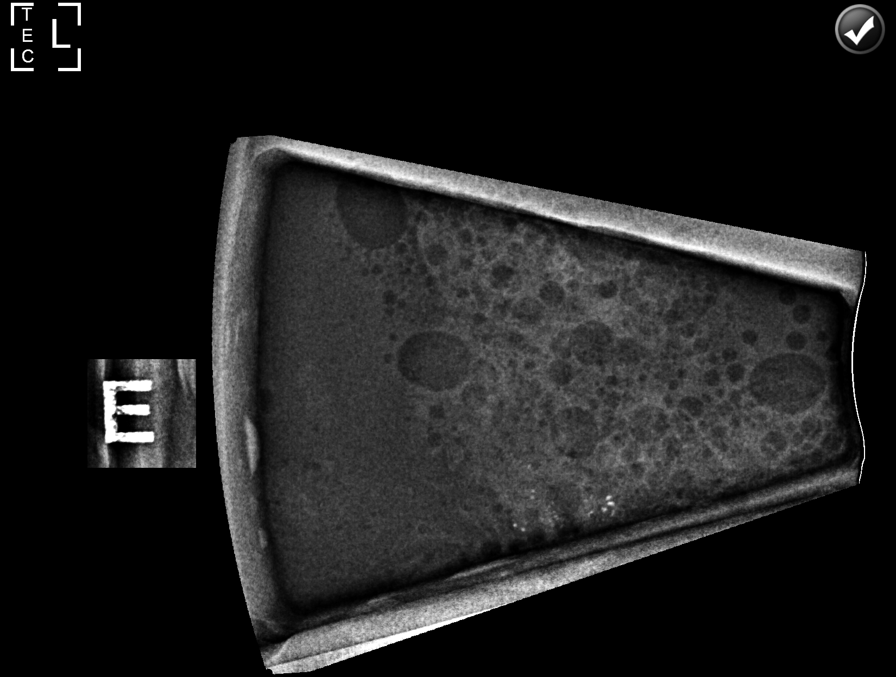

[L (2 of 6)]
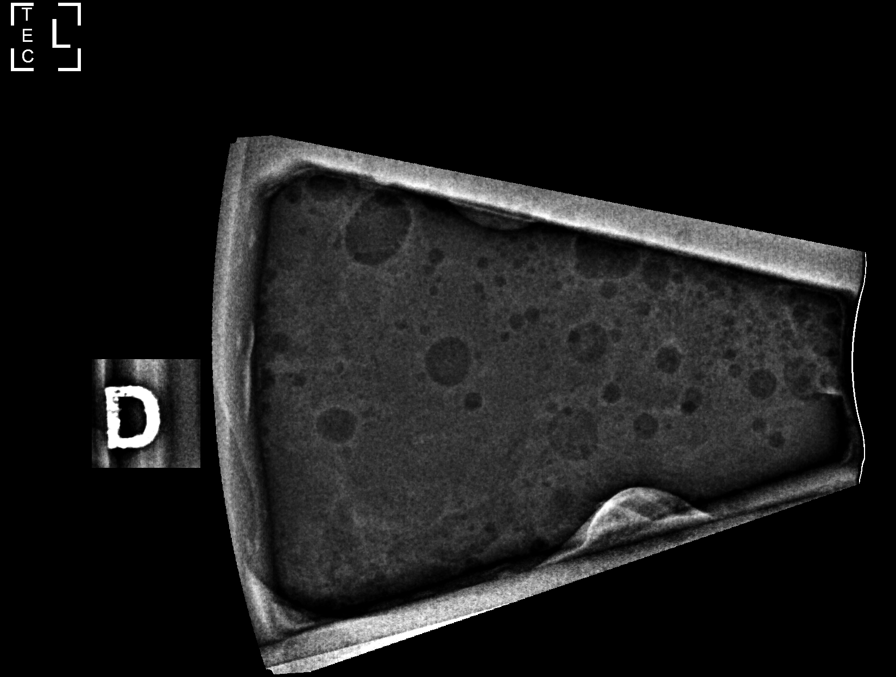

[L (3 of 6)]
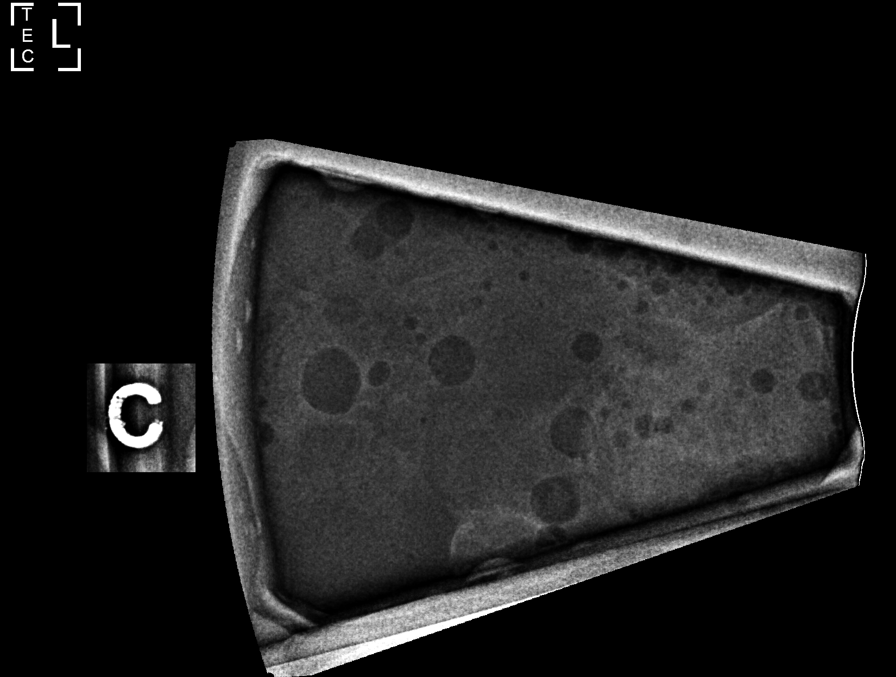

[L (4 of 6)]
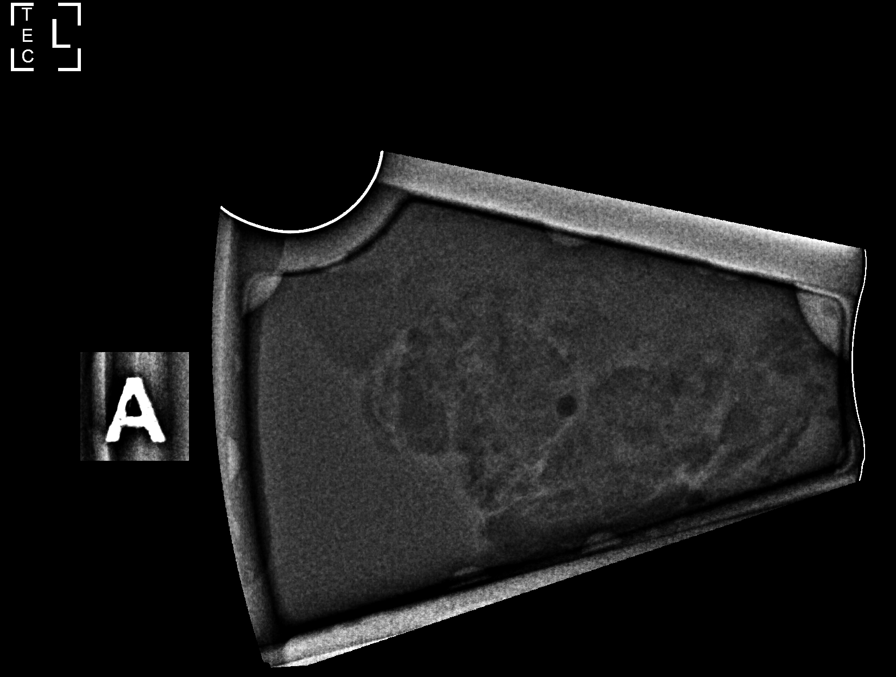

[L (5 of 6)]
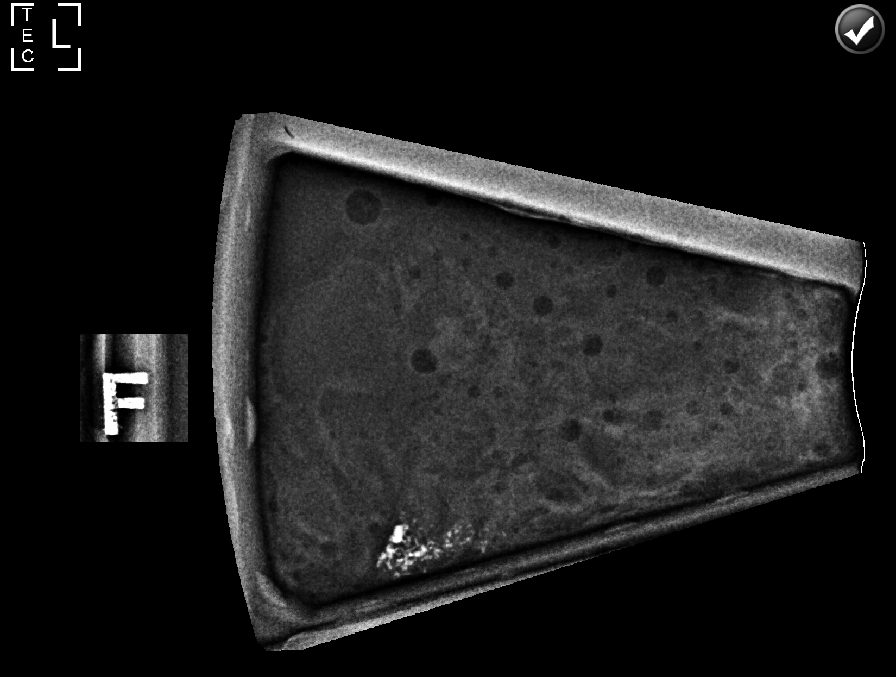

[L (6 of 6)]
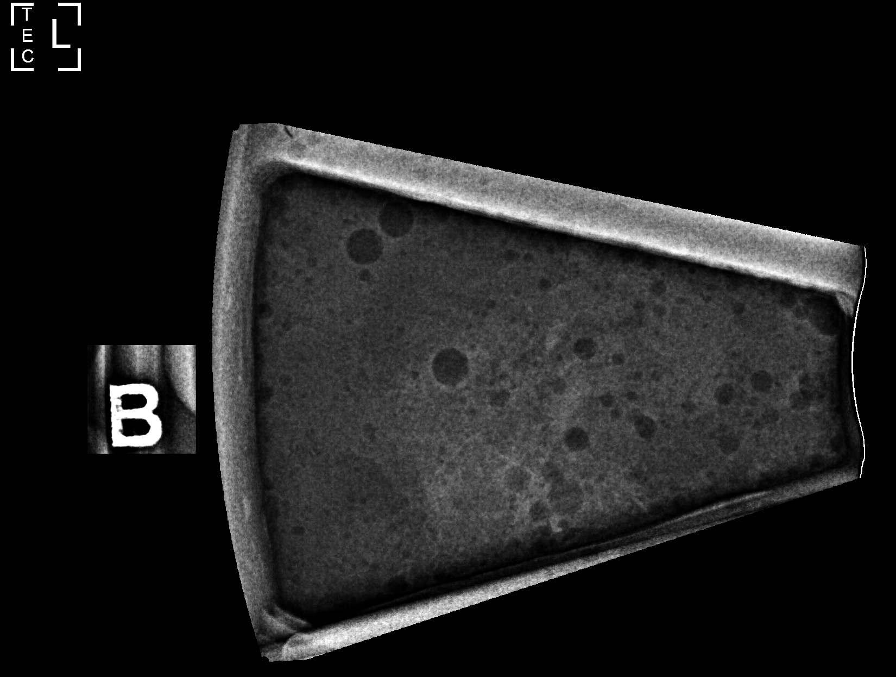

[L LM (1 of 2)]
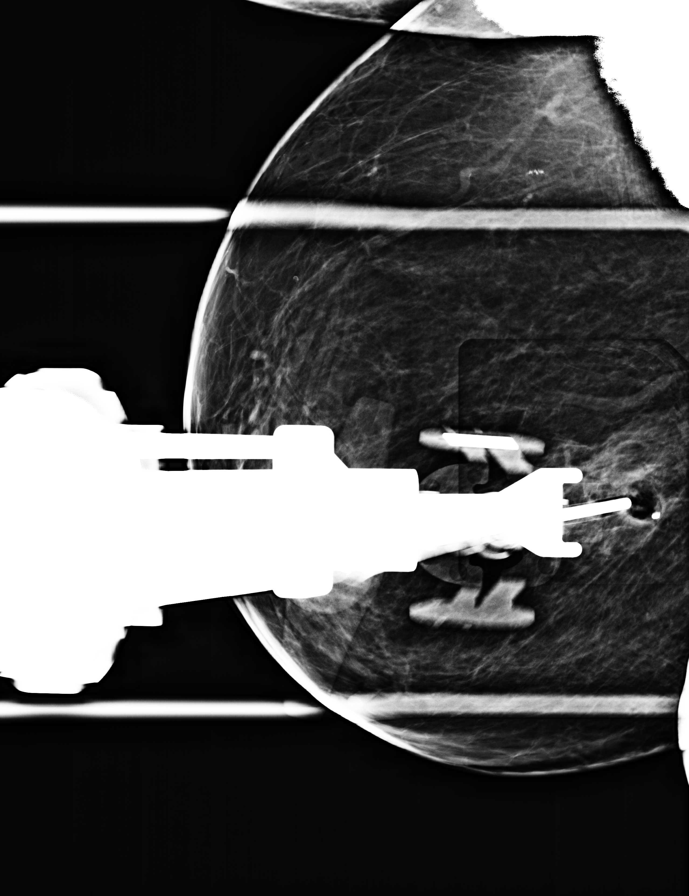

[L LM (2 of 2)]
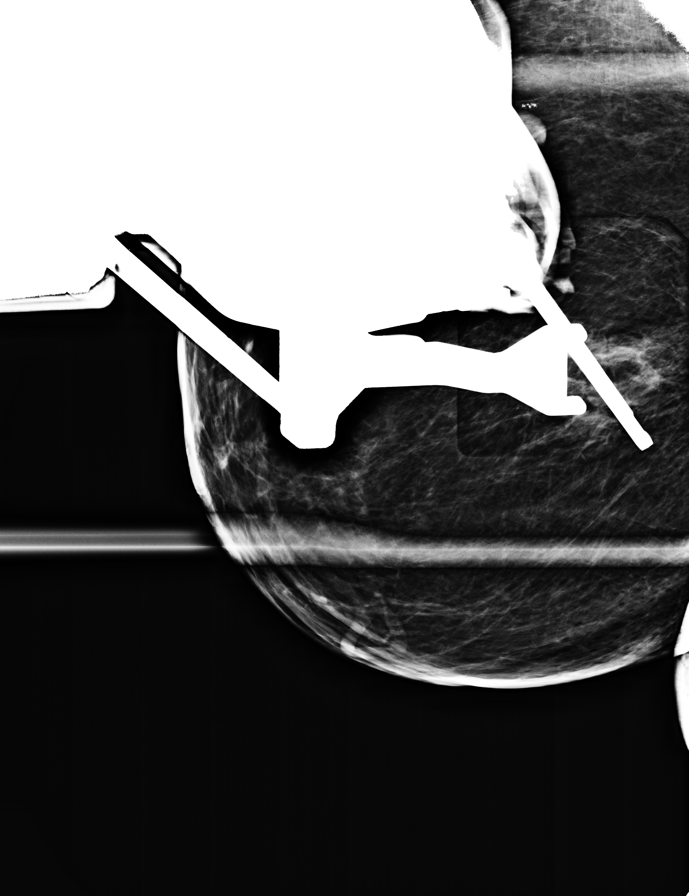

[8 of 29 positions shown; findings below may reference images not displayed]



Using sterile technique and 1% lidocaine and 1% lidocaine with
epinephrine as local anesthetic, under stereotactic guidance, a 9
gauge vacuum assisted device was used to perform core needle biopsy
of calcifications in the lateral aspect of the left breast using a
lateral to medial approach. Specimen radiograph was performed
showing calcifications are present in the tissue samples. Specimens
with calcifications are identified for pathology.

Lesion quadrant: Lateral

At the conclusion of the procedure, coil shaped tissue marker clip
was deployed into the biopsy cavity. Follow-up 2-view mammogram was
performed and dictated separately.
IMPRESSION: Stereotactic-guided biopsy of the left breast. No apparent
complications.

ADDENDUM:
Pathology revealed FIBROADENOMATOID CHANGE WITH CALCIFICATIONS of
the LEFT breast, lateral. This was found to be concordant by Dr.
Kruemel Bodo.

Pathology results were discussed with the patient by telephone. The
patient reported doing well after the biopsy with tenderness at the
site. Post biopsy instructions and care were reviewed and questions
were answered. The patient was encouraged to call The [REDACTED]

The patient was instructed to return for annual screening
mammography and informed a reminder notice would be sent regarding
this appointment.

Pathology results reported by Cari Shah, RN on 10/18/2019.



Using sterile technique and 1% lidocaine and 1% lidocaine with
epinephrine as local anesthetic, under stereotactic guidance, a 9
gauge vacuum assisted device was used to perform core needle biopsy
of calcifications in the lateral aspect of the left breast using a
lateral to medial approach. Specimen radiograph was performed
showing calcifications are present in the tissue samples. Specimens
with calcifications are identified for pathology.

Lesion quadrant: Lateral

At the conclusion of the procedure, coil shaped tissue marker clip
was deployed into the biopsy cavity. Follow-up 2-view mammogram was
performed and dictated separately.
IMPRESSION: Stereotactic-guided biopsy of the left breast. No apparent
complications.

## 2020-10-13 MED FILL — LOSARTAN-HCTZ 50-12.5 MG TA: 50-12.5 | 90 days supply | Qty: 90 | Fill #1

## 2020-11-21 ENCOUNTER — Inpatient Hospital Stay: Payer: 59 | Attending: Hematology

## 2020-11-21 ENCOUNTER — Other Ambulatory Visit: Payer: Self-pay

## 2020-11-21 DIAGNOSIS — Z23 Encounter for immunization: Secondary | ICD-10-CM | POA: Diagnosis not present

## 2020-11-24 ENCOUNTER — Ambulatory Visit: Payer: 59

## 2021-01-06 MED FILL — LOSARTAN-HCTZ 50-12.5 MG TA: 50-12.5 | 28 days supply | Qty: 28 | Fill #2

## 2021-03-24 ENCOUNTER — Other Ambulatory Visit (HOSPITAL_COMMUNITY): Payer: Self-pay

## 2021-03-24 MED FILL — Losartan Potassium & Hydrochlorothiazide Tab 50-12.5 MG: ORAL | 30 days supply | Qty: 30 | Fill #0 | Status: AC

## 2021-04-23 ENCOUNTER — Other Ambulatory Visit (HOSPITAL_COMMUNITY): Payer: Self-pay

## 2021-04-23 MED FILL — Losartan Potassium & Hydrochlorothiazide Tab 50-12.5 MG: ORAL | 30 days supply | Qty: 30 | Fill #1 | Status: AC

## 2021-05-27 ENCOUNTER — Other Ambulatory Visit (HOSPITAL_COMMUNITY): Payer: Self-pay

## 2021-05-27 MED FILL — Losartan Potassium & Hydrochlorothiazide Tab 50-12.5 MG: ORAL | 30 days supply | Qty: 30 | Fill #2 | Status: AC

## 2021-06-25 ENCOUNTER — Other Ambulatory Visit (HOSPITAL_COMMUNITY): Payer: Self-pay

## 2021-06-25 MED FILL — Losartan Potassium & Hydrochlorothiazide Tab 50-12.5 MG: ORAL | 30 days supply | Qty: 30 | Fill #3 | Status: AC

## 2021-06-26 ENCOUNTER — Other Ambulatory Visit (HOSPITAL_COMMUNITY): Payer: Self-pay

## 2021-07-10 ENCOUNTER — Other Ambulatory Visit (HOSPITAL_COMMUNITY): Payer: Self-pay

## 2021-07-10 ENCOUNTER — Encounter: Payer: Self-pay | Admitting: Internal Medicine

## 2021-07-10 ENCOUNTER — Ambulatory Visit: Payer: 59 | Admitting: Internal Medicine

## 2021-07-10 ENCOUNTER — Other Ambulatory Visit: Payer: Self-pay

## 2021-07-10 DIAGNOSIS — R7303 Prediabetes: Secondary | ICD-10-CM | POA: Diagnosis not present

## 2021-07-10 DIAGNOSIS — Z0001 Encounter for general adult medical examination with abnormal findings: Secondary | ICD-10-CM | POA: Insufficient documentation

## 2021-07-10 DIAGNOSIS — E782 Mixed hyperlipidemia: Secondary | ICD-10-CM

## 2021-07-10 DIAGNOSIS — Z0000 Encounter for general adult medical examination without abnormal findings: Secondary | ICD-10-CM | POA: Diagnosis not present

## 2021-07-10 DIAGNOSIS — E559 Vitamin D deficiency, unspecified: Secondary | ICD-10-CM

## 2021-07-10 DIAGNOSIS — I10 Essential (primary) hypertension: Secondary | ICD-10-CM | POA: Diagnosis not present

## 2021-07-10 LAB — LIPID PANEL
Cholesterol: 235 mg/dL — ABNORMAL HIGH (ref 0–200)
HDL: 74.1 mg/dL (ref >39.00)
LDL Cholesterol: 146 mg/dL — ABNORMAL HIGH (ref 0–99)
NonHDL: 160.54
Total CHOL/HDL Ratio: 3
Triglycerides: 72 mg/dL (ref 0.0–149.0)
VLDL: 14.4 mg/dL (ref 0.0–40.0)

## 2021-07-10 LAB — COMPREHENSIVE METABOLIC PANEL
ALT: 9 U/L (ref 0–35)
AST: 18 U/L (ref 0–37)
Albumin: 4.1 g/dL (ref 3.5–5.2)
Alkaline Phosphatase: 61 U/L (ref 39–117)
BUN: 19 mg/dL (ref 6–23)
CO2: 34 mEq/L — ABNORMAL HIGH (ref 19–32)
Calcium: 9.8 mg/dL (ref 8.4–10.5)
Chloride: 98 mEq/L (ref 96–112)
Creatinine, Ser: 0.8 mg/dL (ref 0.40–1.20)
GFR: 76.02 mL/min (ref >60.00)
Glucose, Bld: 104 mg/dL — ABNORMAL HIGH (ref 70–99)
Potassium: 3.8 mEq/L (ref 3.5–5.1)
Sodium: 137 mEq/L (ref 135–145)
Total Bilirubin: 0.6 mg/dL (ref 0.2–1.2)
Total Protein: 8.1 g/dL (ref 6.0–8.3)

## 2021-07-10 LAB — CBC
HCT: 41.3 % (ref 36.0–46.0)
Hemoglobin: 13.3 g/dL (ref 12.0–15.0)
MCHC: 32.2 g/dL (ref 30.0–36.0)
MCV: 93.1 fl (ref 78.0–100.0)
Platelets: 275 10*3/uL (ref 150.0–400.0)
RBC: 4.43 Mil/uL (ref 3.87–5.11)
RDW: 13.4 % (ref 11.5–15.5)
WBC: 7.2 10*3/uL (ref 4.0–10.5)

## 2021-07-10 LAB — HEMOGLOBIN A1C: Hgb A1c MFr Bld: 6.4 % (ref 4.6–6.5)

## 2021-07-10 MED ORDER — LOSARTAN POTASSIUM-HCTZ 50-12.5 MG PO TABS
1.0000 | ORAL_TABLET | Freq: Every day | ORAL | 3 refills | Status: DC
  Filled 2021-07-10: qty 90, 90d supply, fill #0
  Filled 2021-11-06: qty 90, 90d supply, fill #1
  Filled 2022-02-10: qty 90, 90d supply, fill #2
  Filled 2022-05-20: qty 90, 90d supply, fill #3

## 2021-07-10 NOTE — Assessment & Plan Note (Signed)
Checking HgA1c and adjust as needed.  

## 2021-07-10 NOTE — Assessment & Plan Note (Signed)
BP at goal on losartan/hctz 50/12.5 mg daily. Checking CMP and adjust as needed. This is refilled today.

## 2021-07-10 NOTE — Assessment & Plan Note (Signed)
She is working on weight loss with exercise and dietary changes that fit into her lifestyle.

## 2021-07-10 NOTE — Assessment & Plan Note (Signed)
Flu shot yearly. Covid-19 counseled about booster. Pneumonia 13 done counseled about need for 23 or 20. Shingrix counseled due. Tetanus up to date. Colonoscopy up to date. Mammogram up to date, pap smear aged out and dexa up to date. Counseled about sun safety and mole surveillance. Counseled about the dangers of distracted driving. Given 10 year screening recommendations.

## 2021-07-10 NOTE — Patient Instructions (Signed)
We have given you the pneumonia shot today and are checking the blood work.

## 2021-07-10 NOTE — Progress Notes (Signed)
   Subjective:   Patient ID: Penny King, female    DOB: 09-04-1951, 70 y.o.   MRN: [id number]  HPI The patient is a 70 YO female coming in for new patient ongoing care as well as physical.   PMH, FMH, social history reviewed and updated  Review of Systems  Constitutional: Negative.   HENT: Negative.    Eyes: Negative.   Respiratory:  Negative for cough, chest tightness and shortness of breath.   Cardiovascular:  Negative for chest pain, palpitations and leg swelling.  Gastrointestinal:  Negative for abdominal distention, abdominal pain, constipation, diarrhea, nausea and vomiting.  Musculoskeletal: Negative.   Skin: Negative.   Neurological: Negative.   Psychiatric/Behavioral: Negative.     Objective:  Physical Exam Constitutional:      Appearance: She is well-developed. She is obese.  HENT:     Head: Normocephalic and atraumatic.  Cardiovascular:     Rate and Rhythm: Normal rate and regular rhythm.     Heart sounds: Murmur heard.     Comments: Faint systolic murmur Pulmonary:     Effort: Pulmonary effort is normal. No respiratory distress.     Breath sounds: Normal breath sounds. No wheezing or rales.  Abdominal:     General: Bowel sounds are normal. There is no distension.     Palpations: Abdomen is soft.     Tenderness: There is no abdominal tenderness. There is no rebound.  Musculoskeletal:     Cervical back: Normal range of motion.  Skin:    General: Skin is warm and dry.  Neurological:     Mental Status: She is alert and oriented to person, place, and time.     Coordination: Coordination normal.    Vitals:   07/10/21 1039  BP: 122/72  Pulse: 71  Resp: 18  Temp: 98.2 F (36.8 C)  TempSrc: Oral  SpO2: 98%  Weight: 262 lb 6.4 oz (119 kg)  Height: 5\' 6"  (1.676 m)    This visit occurred during the SARS-CoV-2 public health emergency.  Safety protocols were in place, including screening questions prior to the visit, additional usage of staff PPE, and  extensive cleaning of exam room while observing appropriate contact time as indicated for disinfecting solutions.   Assessment & Plan:

## 2021-07-10 NOTE — Assessment & Plan Note (Signed)
Checking lipid panel and not on medications. Adjust as needed.

## 2021-07-10 NOTE — Assessment & Plan Note (Signed)
Taking vitamin D otc.

## 2021-07-20 ENCOUNTER — Other Ambulatory Visit (HOSPITAL_COMMUNITY): Payer: Self-pay

## 2021-07-22 ENCOUNTER — Other Ambulatory Visit (HOSPITAL_COMMUNITY): Payer: Self-pay

## 2021-11-06 ENCOUNTER — Other Ambulatory Visit (HOSPITAL_COMMUNITY): Payer: Self-pay

## 2022-02-10 ENCOUNTER — Other Ambulatory Visit (HOSPITAL_COMMUNITY): Payer: Self-pay

## 2022-05-20 ENCOUNTER — Other Ambulatory Visit (HOSPITAL_COMMUNITY): Payer: Self-pay

## 2022-08-19 ENCOUNTER — Encounter: Payer: Self-pay | Admitting: Internal Medicine

## 2022-08-19 ENCOUNTER — Other Ambulatory Visit (HOSPITAL_COMMUNITY): Payer: Self-pay

## 2022-08-19 ENCOUNTER — Ambulatory Visit: Payer: 59 | Admitting: Internal Medicine

## 2022-08-19 VITALS — BP 132/86 | HR 67 | Temp 97.7°F | Ht 64.0 in | Wt 267.0 lb

## 2022-08-19 DIAGNOSIS — Z0001 Encounter for general adult medical examination with abnormal findings: Secondary | ICD-10-CM

## 2022-08-19 DIAGNOSIS — E559 Vitamin D deficiency, unspecified: Secondary | ICD-10-CM

## 2022-08-19 DIAGNOSIS — R7303 Prediabetes: Secondary | ICD-10-CM

## 2022-08-19 DIAGNOSIS — I10 Essential (primary) hypertension: Secondary | ICD-10-CM

## 2022-08-19 DIAGNOSIS — E782 Mixed hyperlipidemia: Secondary | ICD-10-CM

## 2022-08-19 LAB — LIPID PANEL
Cholesterol: 232 mg/dL — ABNORMAL HIGH (ref 0–200)
HDL: 78 mg/dL (ref >39.00)
LDL Cholesterol: 139 mg/dL — ABNORMAL HIGH (ref 0–99)
NonHDL: 154.49
Total CHOL/HDL Ratio: 3
Triglycerides: 77 mg/dL (ref 0.0–149.0)
VLDL: 15.4 mg/dL (ref 0.0–40.0)

## 2022-08-19 LAB — COMPREHENSIVE METABOLIC PANEL
ALT: 11 U/L (ref 0–35)
AST: 17 U/L (ref 0–37)
Albumin: 3.9 g/dL (ref 3.5–5.2)
Alkaline Phosphatase: 63 U/L (ref 39–117)
BUN: 17 mg/dL (ref 6–23)
CO2: 33 mEq/L — ABNORMAL HIGH (ref 19–32)
Calcium: 9.8 mg/dL (ref 8.4–10.5)
Chloride: 98 mEq/L (ref 96–112)
Creatinine, Ser: 0.9 mg/dL (ref 0.40–1.20)
GFR: 65.49 mL/min (ref >60.00)
Glucose, Bld: 102 mg/dL — ABNORMAL HIGH (ref 70–99)
Potassium: 4 mEq/L (ref 3.5–5.1)
Sodium: 139 mEq/L (ref 135–145)
Total Bilirubin: 0.5 mg/dL (ref 0.2–1.2)
Total Protein: 7.9 g/dL (ref 6.0–8.3)

## 2022-08-19 LAB — CBC
HCT: 39.6 % (ref 36.0–46.0)
Hemoglobin: 12.9 g/dL (ref 12.0–15.0)
MCHC: 32.5 g/dL (ref 30.0–36.0)
MCV: 92.8 fl (ref 78.0–100.0)
Platelets: 289 10*3/uL (ref 150.0–400.0)
RBC: 4.27 Mil/uL (ref 3.87–5.11)
RDW: 13.6 % (ref 11.5–15.5)
WBC: 7.6 10*3/uL (ref 4.0–10.5)

## 2022-08-19 LAB — HEMOGLOBIN A1C: Hgb A1c MFr Bld: 6.5 % (ref 4.6–6.5)

## 2022-08-19 LAB — VITAMIN D 25 HYDROXY (VIT D DEFICIENCY, FRACTURES): VITD: 42.56 ng/mL (ref 30.00–100.00)

## 2022-08-19 MED ORDER — LOSARTAN POTASSIUM-HCTZ 50-12.5 MG PO TABS
1.0000 | ORAL_TABLET | Freq: Every day | ORAL | 3 refills | Status: DC
  Filled 2022-08-19: qty 90, 90d supply, fill #0
  Filled 2022-11-26: qty 90, 90d supply, fill #1
  Filled 2023-02-23: qty 90, 90d supply, fill #2
  Filled 2023-06-13: qty 90, 90d supply, fill #3

## 2022-08-19 NOTE — Progress Notes (Unsigned)
   Subjective:   Patient ID: Penny King, female    DOB: Aug 24, 1951, 71 y.o.   MRN: [id number]  Hypertension Pertinent negatives include no chest pain, palpitations or shortness of breath.   The patient is here for physical.  PMH, FMH, social history reviewed and updated  Review of Systems  Constitutional: Negative.   HENT: Negative.    Eyes: Negative.   Respiratory:  Negative for cough, chest tightness and shortness of breath.   Cardiovascular:  Negative for chest pain, palpitations and leg swelling.  Gastrointestinal:  Negative for abdominal distention, abdominal pain, constipation, diarrhea, nausea and vomiting.  Musculoskeletal: Negative.   Skin: Negative.   Neurological: Negative.   Psychiatric/Behavioral: Negative.      Objective:  Physical Exam Constitutional:      Appearance: She is well-developed. She is obese.  HENT:     Head: Normocephalic and atraumatic.  Cardiovascular:     Rate and Rhythm: Normal rate and regular rhythm.  Pulmonary:     Effort: Pulmonary effort is normal. No respiratory distress.     Breath sounds: Normal breath sounds. No wheezing or rales.  Abdominal:     General: Bowel sounds are normal. There is no distension.     Palpations: Abdomen is soft.     Tenderness: There is no abdominal tenderness. There is no rebound.  Musculoskeletal:     Cervical back: Normal range of motion.  Skin:    General: Skin is warm and dry.  Neurological:     Mental Status: She is alert and oriented to person, place, and time.     Coordination: Coordination normal.     Vitals:   08/19/22 0941  BP: 132/86  Pulse: 67  Temp: 97.7 F (36.5 C)  SpO2: 97%  Weight: 267 lb (121.1 kg)  Height: 5\' 4"  (1.626 m)    Assessment & Plan:

## 2022-08-20 NOTE — Assessment & Plan Note (Signed)
BP at goal on losartan/hctz 50/12.5 mg daily. Checking CMP and adjust as needed. 

## 2022-08-20 NOTE — Assessment & Plan Note (Signed)
Flu shot will get at work. Covid-19 counseled. Pneumonia counseled. Shingrix complete. Tetanus up to date. Colonoscopy up to date. Mammogram up to date with gyn, pap smear aged out and dexa up to date. Counseled about sun safety and mole surveillance. Counseled about the dangers of distracted driving. Given 10 year screening recommendations.

## 2022-08-20 NOTE — Assessment & Plan Note (Signed)
Checking lipid panel and adjust as needed. Diet controlled.  

## 2022-08-20 NOTE — Assessment & Plan Note (Signed)
Checking HgA1c and adjust as needed. Counseled about diet.

## 2022-08-20 NOTE — Assessment & Plan Note (Addendum)
Checking vitamin D level and adjust as needed.  

## 2022-08-20 NOTE — Assessment & Plan Note (Signed)
Counseled about weight and need for diet/lifestyle changes to help.

## 2022-11-17 ENCOUNTER — Ambulatory Visit: Payer: 59 | Admitting: Internal Medicine

## 2022-11-26 ENCOUNTER — Other Ambulatory Visit (HOSPITAL_COMMUNITY): Payer: Self-pay

## 2022-12-15 ENCOUNTER — Ambulatory Visit: Payer: Commercial Managed Care - PPO | Admitting: Internal Medicine

## 2023-01-09 DIAGNOSIS — R55 Syncope and collapse: Secondary | ICD-10-CM | POA: Diagnosis not present

## 2023-01-09 DIAGNOSIS — N390 Urinary tract infection, site not specified: Secondary | ICD-10-CM | POA: Diagnosis not present

## 2023-01-11 ENCOUNTER — Telehealth: Payer: Self-pay | Admitting: Internal Medicine

## 2023-01-11 NOTE — Telephone Encounter (Signed)
Pt must make an [facility] f/u first before e the referral can be place. Pls call pt to make her hops f/u.Marland KitchenJohny King

## 2023-01-11 NOTE — Telephone Encounter (Signed)
Called pt lvm to call the office back to schedule Hospital FU with Dr. Sharlet Salina.

## 2023-01-11 NOTE — Telephone Encounter (Signed)
Patient called stating that she was just released from the hospital on Sunday. The doctors told to request that her PCP Dr. Sharlet Salina send in a referral for her to see a Cardiologist. Patient is request this referral. Best callback number for patient is [phone].

## 2023-01-21 ENCOUNTER — Ambulatory Visit: Payer: Commercial Managed Care - PPO | Admitting: Internal Medicine

## 2023-01-25 ENCOUNTER — Ambulatory Visit: Payer: Commercial Managed Care - PPO | Admitting: Internal Medicine

## 2023-01-25 ENCOUNTER — Encounter: Payer: Self-pay | Admitting: Internal Medicine

## 2023-01-25 VITALS — BP 122/80 | HR 43 | Temp 97.8°F | Ht 64.0 in | Wt 258.0 lb

## 2023-01-25 DIAGNOSIS — Z1231 Encounter for screening mammogram for malignant neoplasm of breast: Secondary | ICD-10-CM

## 2023-01-25 DIAGNOSIS — R55 Syncope and collapse: Secondary | ICD-10-CM | POA: Diagnosis not present

## 2023-01-25 DIAGNOSIS — I679 Cerebrovascular disease, unspecified: Secondary | ICD-10-CM | POA: Diagnosis not present

## 2023-01-25 NOTE — Progress Notes (Unsigned)
   Subjective:   Patient ID: Penny King, female    DOB: 1951-01-19, 72 y.o.   MRN: [id number]  HPI The patient is a 73 YO female coming in for ER follow up (syncope in church without eating/drinking prior). No recurrent symptoms. All labs and imaging normal.   PMH, [place], social history reviewed and updated  Review of Systems  Constitutional: Negative.   HENT: Negative.    Eyes: Negative.   Respiratory:  Negative for cough, chest tightness and shortness of breath.   Cardiovascular:  Negative for chest pain, palpitations and leg swelling.  Gastrointestinal:  Negative for abdominal distention, abdominal pain, constipation, diarrhea, nausea and vomiting.  Musculoskeletal: Negative.   Skin: Negative.   Neurological:  Positive for syncope.  Psychiatric/Behavioral: Negative.      Objective:  Physical Exam Constitutional:      Appearance: She is well-developed.  HENT:     Head: Normocephalic and atraumatic.  Cardiovascular:     Rate and Rhythm: Normal rate and regular rhythm.     Heart sounds: Murmur heard.  Pulmonary:     Effort: Pulmonary effort is normal. No respiratory distress.     Breath sounds: Normal breath sounds. No wheezing or rales.  Abdominal:     General: Bowel sounds are normal. There is no distension.     Palpations: Abdomen is soft.     Tenderness: There is no abdominal tenderness. There is no rebound.  Musculoskeletal:     Cervical back: Normal range of motion.  Skin:    General: Skin is warm and dry.  Neurological:     Mental Status: She is alert and oriented to person, place, and time.     Coordination: Coordination normal.     Vitals:   01/25/23 1539  BP: 122/80  Pulse: (!) 43  Temp: 97.8 F (36.6 C)  TempSrc: Oral  SpO2: 98%  Weight: 258 lb (117 kg)  Height: 5' 4"$  (1.626 m)    Assessment & Plan:  Visit time 20 minutes in face to face communication with patient and coordination of care, additional 10 minutes spent in record review,  coordination or care, ordering tests, communicating/referring to other healthcare professionals, documenting in medical records all on the same day of the visit for total time 30 minutes spent on the visit.

## 2023-01-26 DIAGNOSIS — R55 Syncope and collapse: Secondary | ICD-10-CM | POA: Insufficient documentation

## 2023-01-26 DIAGNOSIS — I679 Cerebrovascular disease, unspecified: Secondary | ICD-10-CM | POA: Insufficient documentation

## 2023-01-26 NOTE — Assessment & Plan Note (Signed)
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 Discussed with patient and daughter. Recommended statin or diet/lifestyle change. Her LDL 138 is above new goal of 100. We will recheck lipid panel in 3-6 months and if persistently high she agrees to cholesterol medicine.

## 2023-01-26 NOTE — Assessment & Plan Note (Signed)
Most likely explanation is vasovagal with standing at church without eating or drinking much. Advised to stay hydrated and eating regularly in that situation. Labs and imaging reviewed and no change from prior. Has aortic stenosis known mild murmur unchanged on exam. If recurrent symptoms will recheck ECHO.

## 2023-02-04 ENCOUNTER — Ambulatory Visit: Payer: Commercial Managed Care - PPO | Admitting: Internal Medicine

## 2023-02-24 ENCOUNTER — Other Ambulatory Visit (HOSPITAL_COMMUNITY): Payer: Self-pay

## 2023-02-24 ENCOUNTER — Other Ambulatory Visit: Payer: Self-pay

## 2023-02-28 ENCOUNTER — Other Ambulatory Visit (HOSPITAL_COMMUNITY): Payer: Self-pay

## 2023-03-24 ENCOUNTER — Ambulatory Visit
Admission: RE | Admit: 2023-03-24 | Discharge: 2023-03-24 | Disposition: A | Discharge status: Home / Self Care | Payer: Commercial Managed Care - PPO | Source: Ambulatory Visit | Attending: Internal Medicine | Admitting: Internal Medicine

## 2023-03-24 DIAGNOSIS — Z1231 Encounter for screening mammogram for malignant neoplasm of breast: Secondary | ICD-10-CM | POA: Diagnosis not present

## 2023-08-18 ENCOUNTER — Emergency Department (HOSPITAL_COMMUNITY): Payer: Commercial Managed Care - PPO

## 2023-08-18 ENCOUNTER — Emergency Department (HOSPITAL_COMMUNITY)
Admission: EM | Admit: 2023-08-18 | Discharge: 2023-08-18 | Disposition: A | Discharge status: Home / Self Care | Payer: Commercial Managed Care - PPO | Attending: Emergency Medicine | Admitting: Emergency Medicine

## 2023-08-18 ENCOUNTER — Other Ambulatory Visit: Payer: Self-pay

## 2023-08-18 DIAGNOSIS — Z720 Tobacco use: Secondary | ICD-10-CM | POA: Insufficient documentation

## 2023-08-18 DIAGNOSIS — S5002XA Contusion of left elbow, initial encounter: Secondary | ICD-10-CM | POA: Diagnosis not present

## 2023-08-18 DIAGNOSIS — M799 Soft tissue disorder, unspecified: Secondary | ICD-10-CM | POA: Diagnosis not present

## 2023-08-18 DIAGNOSIS — I10 Essential (primary) hypertension: Secondary | ICD-10-CM | POA: Insufficient documentation

## 2023-08-18 DIAGNOSIS — Y990 Civilian activity done for income or pay: Secondary | ICD-10-CM | POA: Diagnosis not present

## 2023-08-18 DIAGNOSIS — W228XXA Striking against or struck by other objects, initial encounter: Secondary | ICD-10-CM | POA: Diagnosis not present

## 2023-08-18 DIAGNOSIS — S59902A Unspecified injury of left elbow, initial encounter: Secondary | ICD-10-CM | POA: Diagnosis not present

## 2023-08-18 DIAGNOSIS — M25522 Pain in left elbow: Secondary | ICD-10-CM | POA: Diagnosis not present

## 2023-08-18 MED ORDER — IBUPROFEN 800 MG PO TABS
800.0000 mg | ORAL_TABLET | Freq: Once | ORAL | Status: AC
  Administered 2023-08-18: 800 mg via ORAL
  Filled 2023-08-18: qty 1

## 2023-08-18 NOTE — Discharge Instructions (Signed)
You have been evaluated for your symptoms.  Fortunately no broken bone or dislocation.  Please wrap the elbow with Ace wrap for support.  You may take over-the-counter Tylenol or ibuprofen as needed for pain.  May follow-up with orthopedic doctor for further care if symptoms persist.

## 2023-08-18 NOTE — ED Triage Notes (Signed)
Pt hit left elbow on a wall. Large lump noted to left elbow. Pt able to fully extend arm but it is painful. Radial pulse strong.

## 2023-08-18 NOTE — ED Provider Notes (Signed)
[place] EMERGENCY DEPARTMENT AT [facility] Provider Note   CSN: [id number] Arrival date & time: 08/18/23  1230     History  Chief Complaint  Patient presents with   Arm Injury    Left arm     Penny King is a 72 y.o. female.  The history is provided by the patient and medical records. No language interpreter was used.  Arm Injury    Penny King is a 72 y.o. female with past medical history of hypertension who presents to the ED after L elbow injury. 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 She noticed immediate swelling over the L elbow. Denies weakness, numbness/tingling, or significant pain. She is able to move the arm without difficulty or significant pain. She does not take blood thinners.   Home Medications Prior to Admission medications   Medication Sig Start Date End Date Taking? Authorizing Provider  acetaminophen (TYLENOL) 500 MG tablet Take 1,000 mg by mouth as needed.    [provider]  Ergocalciferol 50 MCG (2000 UT) TABS Take 1 tablet by mouth daily. 10/29/19   Doristine Bosworth, MD  losartan-hydrochlorothiazide (HYZAAR) 50-12.5 MG tablet Take 1 tablet by mouth daily. 08/19/22 09/14/23  Myrlene Broker, MD      Allergies    Patient has no known allergies.    Review of Systems   Review of Systems  All other systems reviewed and are negative.   Physical Exam Updated Vital Signs BP (!) 148/94   Pulse 91   Temp 98.2 F (36.8 C)   Resp 18   Ht 5\' 7"  (1.702 m)   Wt 117.9 kg   SpO2 97%   BMI 40.72 kg/m  Physical Exam Vitals and nursing note reviewed.  Constitutional:      General: She is not in acute distress.    Appearance: She is well-developed.  HENT:     Head: Atraumatic.  Eyes:     Conjunctiva/sclera: Conjunctivae normal.  Pulmonary:     Effort: Pulmonary effort is normal.  Musculoskeletal:        General:  Tenderness (Left elbow: There is a hematoma noted to the posterior elbow with tenderness to palpation but patient able to flex and extend elbow and no obvious deformity noted.) present.     Cervical back: Neck supple.     Comments: Left shoulder left wrist nontender to palpation radial pulse 2+ normal grip strength to left hand.  Skin:    Findings: No rash.  Neurological:     Mental Status: She is alert.  Psychiatric:        Mood and Affect: Mood normal.     ED Results / Procedures / Treatments   Labs (all labs ordered are listed, but only abnormal results are displayed) Labs Reviewed - No data to display  EKG None  Radiology DG Elbow Complete Left  Result Date: 08/18/2023 CLINICAL DATA:  Trauma to the left elbow against a wall, now with pain on movement EXAM: LEFT ELBOW - COMPLETE 4 VIEW COMPARISON:  None Available. FINDINGS: There is no evidence of fracture, dislocation, or joint effusion. Irregular radiodensities projecting posterior to the olecranon, likely degenerative. Ovoid soft tissue density projecting over the olecranon. IMPRESSION: 1. No acute fracture or dislocation. 2. Ovoid soft tissue density projecting over the olecranon, may represent a hematoma. Electronically Signed   By: Milus Height.D.  On: 08/18/2023 14:14    Procedures Procedures    Medications Ordered in ED Medications  ibuprofen (ADVIL) tablet 800 mg (800 mg Oral Given 08/18/23 1329)    ED Course/ Medical Decision Making/ A&P                                 Medical Decision Making Amount and/or Complexity of Data Reviewed Radiology: ordered.  Risk Prescription drug management.   BP (!) 148/94   Pulse 91   Temp 98.2 F (36.8 C)   Resp 18   Ht 5\' 7"  (1.702 m)   Wt 117.9 kg   SpO2 97%   BMI 40.72 kg/m   3:16 PM Penny King is a 72 y.o. female with past medical history of hypertension who presents to the ED after L elbow injury. Patient is an employee at [employer], was in the ICU this morning  seeing a patient when she accidentally slammed her elbow into the corner of wall. She noticed immediate swelling over the L elbow. Denies weakness, numbness/tingling, or significant pain. She is able to move the arm without difficulty or significant pain. She does not take blood thinners.   On exam, patient has tenderness and hematoma noted to the posterior left elbow but she is able to flex and extend her elbow without difficulty.  She is neurovascular intact.  X-ray of left elbow obtained independent viewed inter by me without any acute fracture or dislocation.  Agree with radiology interpretation.  Patient does have some swelling noted to her posterior elbow consistent with a hematoma.  Will provide Ace wraps and I discussed ice therapy.  Ibuprofen given for pain with improvement of symptoms.  Patient otherwise stable to be discharged home.  Will provide referral to orthopedic as needed.  Work note provided. Recommend over-the-counter Tylenol or ibuprofen as needed for pain.  Social history of health including tobacco use.        Final Clinical Impression(s) / ED Diagnoses Final diagnoses:  Contusion of left elbow, initial encounter    Rx / DC Orders ED Discharge Orders     None         Fayrene Helper, PA-C 08/18/23 1520    Laurence Spates, MD 08/19/23 [phone]

## 2023-09-06 ENCOUNTER — Other Ambulatory Visit: Payer: Self-pay | Admitting: Internal Medicine

## 2023-09-06 ENCOUNTER — Other Ambulatory Visit (HOSPITAL_COMMUNITY): Payer: Self-pay

## 2023-09-06 DIAGNOSIS — I10 Essential (primary) hypertension: Secondary | ICD-10-CM

## 2023-09-06 MED ORDER — LOSARTAN POTASSIUM-HCTZ 50-12.5 MG PO TABS
1.0000 | ORAL_TABLET | Freq: Every day | ORAL | 3 refills | Status: DC
  Filled 2023-09-06: qty 90, 90d supply, fill #0
  Filled 2023-12-06: qty 90, 90d supply, fill #1

## 2023-09-07 ENCOUNTER — Other Ambulatory Visit (HOSPITAL_COMMUNITY): Payer: Self-pay

## 2023-12-14 ENCOUNTER — Other Ambulatory Visit (HOSPITAL_COMMUNITY): Payer: Self-pay

## 2024-02-21 ENCOUNTER — Other Ambulatory Visit: Payer: Self-pay | Admitting: Internal Medicine

## 2024-02-21 DIAGNOSIS — Z1231 Encounter for screening mammogram for malignant neoplasm of breast: Secondary | ICD-10-CM

## 2024-03-01 ENCOUNTER — Other Ambulatory Visit (HOSPITAL_BASED_OUTPATIENT_CLINIC_OR_DEPARTMENT_OTHER): Payer: Self-pay

## 2024-03-01 ENCOUNTER — Encounter: Payer: Self-pay | Admitting: Internal Medicine

## 2024-03-01 ENCOUNTER — Other Ambulatory Visit (HOSPITAL_COMMUNITY): Payer: Self-pay

## 2024-03-01 ENCOUNTER — Ambulatory Visit (INDEPENDENT_AMBULATORY_CARE_PROVIDER_SITE_OTHER): Payer: Commercial Managed Care - PPO | Admitting: Internal Medicine

## 2024-03-01 VITALS — BP 140/80 | HR 78 | Temp 98.0°F | Ht 67.0 in | Wt 265.0 lb

## 2024-03-01 DIAGNOSIS — R7303 Prediabetes: Secondary | ICD-10-CM | POA: Diagnosis not present

## 2024-03-01 DIAGNOSIS — Z0000 Encounter for general adult medical examination without abnormal findings: Secondary | ICD-10-CM

## 2024-03-01 DIAGNOSIS — E782 Mixed hyperlipidemia: Secondary | ICD-10-CM

## 2024-03-01 DIAGNOSIS — I679 Cerebrovascular disease, unspecified: Secondary | ICD-10-CM | POA: Diagnosis not present

## 2024-03-01 DIAGNOSIS — I10 Essential (primary) hypertension: Secondary | ICD-10-CM

## 2024-03-01 DIAGNOSIS — Z1211 Encounter for screening for malignant neoplasm of colon: Secondary | ICD-10-CM

## 2024-03-01 DIAGNOSIS — Z0001 Encounter for general adult medical examination with abnormal findings: Secondary | ICD-10-CM

## 2024-03-01 LAB — COMPREHENSIVE METABOLIC PANEL WITH GFR
ALT: 9 U/L (ref 0–35)
AST: 17 U/L (ref 0–37)
Albumin: 4.2 g/dL (ref 3.5–5.2)
Alkaline Phosphatase: 66 U/L (ref 39–117)
BUN: 17 mg/dL (ref 6–23)
CO2: 33 meq/L — ABNORMAL HIGH (ref 19–32)
Calcium: 9.9 mg/dL (ref 8.4–10.5)
Chloride: 97 meq/L (ref 96–112)
Creatinine, Ser: 0.78 mg/dL (ref 0.40–1.20)
GFR: 76.93 mL/min (ref >60.00)
Glucose, Bld: 100 mg/dL — ABNORMAL HIGH (ref 70–99)
Potassium: 4.2 meq/L (ref 3.5–5.1)
Sodium: 139 meq/L (ref 135–145)
Total Bilirubin: 0.5 mg/dL (ref 0.2–1.2)
Total Protein: 8.1 g/dL (ref 6.0–8.3)

## 2024-03-01 LAB — CBC
HCT: 42 % (ref 36.0–46.0)
Hemoglobin: 13.6 g/dL (ref 12.0–15.0)
MCHC: 32.5 g/dL (ref 30.0–36.0)
MCV: 94.4 fl (ref 78.0–100.0)
Platelets: 270 10*3/uL (ref 150.0–400.0)
RBC: 4.45 Mil/uL (ref 3.87–5.11)
RDW: 13.4 % (ref 11.5–15.5)
WBC: 7.6 10*3/uL (ref 4.0–10.5)

## 2024-03-01 LAB — LIPID PANEL
Cholesterol: 249 mg/dL — ABNORMAL HIGH (ref 0–200)
HDL: 77.7 mg/dL (ref >39.00)
LDL Cholesterol: 152 mg/dL — ABNORMAL HIGH (ref 0–99)
NonHDL: 171.28
Total CHOL/HDL Ratio: 3
Triglycerides: 96 mg/dL (ref 0.0–149.0)
VLDL: 19.2 mg/dL (ref 0.0–40.0)

## 2024-03-01 LAB — HEMOGLOBIN A1C: Hgb A1c MFr Bld: 6.6 % — ABNORMAL HIGH (ref 4.6–6.5)

## 2024-03-01 LAB — MICROALBUMIN / CREATININE URINE RATIO
Creatinine,U: 75.6 mg/dL
Microalb Creat Ratio: UNDETERMINED mg/g (ref 0.0–30.0)
Microalb, Ur: <0.7 mg/dL

## 2024-03-01 MED ORDER — LOSARTAN POTASSIUM-HCTZ 50-12.5 MG PO TABS
1.0000 | ORAL_TABLET | Freq: Every day | ORAL | 3 refills | Status: AC
  Filled 2024-03-01 – 2024-03-15 (×2): qty 90, 90d supply, fill #0
  Filled 2024-07-02: qty 90, 90d supply, fill #1
  Filled 2024-10-01: qty 90, 90d supply, fill #2
  Filled 2024-12-28: qty 90, 90d supply, fill #3

## 2024-03-01 NOTE — Assessment & Plan Note (Signed)
Counseled about diet and exercise. 

## 2024-03-01 NOTE — Assessment & Plan Note (Signed)
 Checking HgA1c and counseled about nutrition.

## 2024-03-01 NOTE — Assessment & Plan Note (Signed)
 BP at goal on losartan/hydrochlorothiazide 50/12.5 mg daily. Checking CMP and adjust as needed.

## 2024-03-01 NOTE — Progress Notes (Signed)
   Subjective:   Patient ID: Penny King, female    DOB: 01/12/1951, 73 y.o.   MRN: [id number]  HPI The patient is here for physical. Also has concerns about joint pain which is moderate but tylenol helps and she is using that effectively.   PMH, [facility], social history reviewed and updated  Review of Systems  Constitutional: Negative.   HENT: Negative.    Eyes: Negative.   Respiratory:  Negative for cough, chest tightness and shortness of breath.   Cardiovascular:  Negative for chest pain, palpitations and leg swelling.  Gastrointestinal:  Negative for abdominal distention, abdominal pain, constipation, diarrhea, nausea and vomiting.  Musculoskeletal:  Positive for arthralgias.  Skin: Negative.   Neurological: Negative.   Psychiatric/Behavioral: Negative.      Objective:  Physical Exam Constitutional:      Appearance: She is well-developed.  HENT:     Head: Normocephalic and atraumatic.  Cardiovascular:     Rate and Rhythm: Normal rate and regular rhythm.  Pulmonary:     Effort: Pulmonary effort is normal. No respiratory distress.     Breath sounds: Normal breath sounds. No wheezing or rales.  Abdominal:     General: Bowel sounds are normal. There is no distension.     Palpations: Abdomen is soft.     Tenderness: There is no abdominal tenderness. There is no rebound.  Musculoskeletal:     Cervical back: Normal range of motion.  Skin:    General: Skin is warm and dry.  Neurological:     Mental Status: She is alert and oriented to person, place, and time.     Coordination: Coordination normal.     Vitals:   03/01/24 1006  BP: (!) 140/80  Pulse: 78  Temp: 98 F (36.7 C)  TempSrc: Oral  SpO2: 96%  Weight: 265 lb (120.2 kg)  Height: 5\' 7"  (1.702 m)    Assessment & Plan:

## 2024-03-01 NOTE — Assessment & Plan Note (Signed)
 Checking lipid panel and adjust as needed.

## 2024-03-01 NOTE — Assessment & Plan Note (Signed)
 Flu shot up to date. Pneumonia declines. Shingrix declines. Tetanus up to date. Cologuard ordered prior colonoscopy with poor prep. Mammogram scheduled already, pap smear aged out and dexa complete. Counseled about sun safety and mole surveillance. Counseled about the dangers of distracted driving. Given 10 year screening recommendations.

## 2024-03-01 NOTE — Assessment & Plan Note (Signed)
 She did not end up starting cholesterol medicine last year and will check lipid panel today with plans to start statin for goal LDL <100.

## 2024-03-12 ENCOUNTER — Other Ambulatory Visit (HOSPITAL_COMMUNITY): Payer: Self-pay

## 2024-03-13 DIAGNOSIS — Z1211 Encounter for screening for malignant neoplasm of colon: Secondary | ICD-10-CM | POA: Diagnosis not present

## 2024-03-15 ENCOUNTER — Other Ambulatory Visit (HOSPITAL_COMMUNITY): Payer: Self-pay

## 2024-03-19 LAB — COLOGUARD: COLOGUARD: NEGATIVE

## 2024-03-20 ENCOUNTER — Encounter: Payer: Self-pay | Admitting: Internal Medicine

## 2024-03-20 LAB — HM COLONOSCOPY

## 2024-03-20 LAB — COLOGUARD: Cologuard: NEGATIVE

## 2024-03-20 NOTE — Progress Notes (Signed)
 Abstract was an error, this was meant for cologaurd and not colonoscopy

## 2024-03-30 ENCOUNTER — Ambulatory Visit

## 2024-04-17 ENCOUNTER — Ambulatory Visit
Admission: RE | Admit: 2024-04-17 | Discharge: 2024-04-17 | Disposition: A | Discharge status: Home / Self Care | Source: Ambulatory Visit | Attending: Internal Medicine | Admitting: Internal Medicine

## 2024-04-17 DIAGNOSIS — Z1231 Encounter for screening mammogram for malignant neoplasm of breast: Secondary | ICD-10-CM | POA: Diagnosis not present

## 2024-04-20 ENCOUNTER — Ambulatory Visit: Payer: Self-pay | Admitting: Internal Medicine

## 2024-07-02 ENCOUNTER — Other Ambulatory Visit (HOSPITAL_COMMUNITY): Payer: Self-pay

## 2024-10-01 ENCOUNTER — Other Ambulatory Visit (HOSPITAL_COMMUNITY): Payer: Self-pay

## 2024-11-08 ENCOUNTER — Other Ambulatory Visit (HOSPITAL_COMMUNITY): Payer: Self-pay

## 2024-12-28 ENCOUNTER — Other Ambulatory Visit (HOSPITAL_COMMUNITY): Payer: Self-pay

## 2024-12-30 ENCOUNTER — Other Ambulatory Visit (HOSPITAL_COMMUNITY): Payer: Self-pay
